# Patient Record
Sex: Female | Born: 1980 | Race: Black or African American | Hispanic: No | Marital: Single | Smoking: Current every day smoker
Health system: Southern US, Community
[De-identification: ages and names within clinical notes are randomized; demographics above are authoritative.]

## PROBLEM LIST (undated history)

## (undated) DIAGNOSIS — I10 Essential (primary) hypertension: Secondary | ICD-10-CM

## (undated) HISTORY — DX: Essential (primary) hypertension: I10

---
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
## 2002-07-01 ENCOUNTER — Other Ambulatory Visit: Admission: RE | Admit: 2002-07-01 | Discharge: 2002-07-01 | Payer: Self-pay | Admitting: Gynecology

## 2004-01-10 ENCOUNTER — Emergency Department (HOSPITAL_COMMUNITY): Admission: EM | Admit: 2004-01-10 | Discharge: 2004-01-10 | Payer: Self-pay | Admitting: Emergency Medicine

## 2004-11-21 ENCOUNTER — Emergency Department (HOSPITAL_COMMUNITY): Admission: EM | Admit: 2004-11-21 | Discharge: 2004-11-21 | Payer: Self-pay | Admitting: Emergency Medicine

## 2005-08-09 ENCOUNTER — Emergency Department (HOSPITAL_COMMUNITY): Admission: EM | Admit: 2005-08-09 | Discharge: 2005-08-09 | Payer: Self-pay | Admitting: Family Medicine

## 2005-12-14 ENCOUNTER — Emergency Department (HOSPITAL_COMMUNITY): Admission: AD | Admit: 2005-12-14 | Discharge: 2005-12-14 | Payer: Self-pay | Admitting: Family Medicine

## 2006-03-25 ENCOUNTER — Inpatient Hospital Stay (HOSPITAL_COMMUNITY): Admission: AD | Admit: 2006-03-25 | Discharge: 2006-03-26 | Payer: Self-pay | Admitting: Obstetrics & Gynecology

## 2006-04-06 ENCOUNTER — Inpatient Hospital Stay (HOSPITAL_COMMUNITY): Admission: AD | Admit: 2006-04-06 | Discharge: 2006-04-06 | Payer: Self-pay | Admitting: Obstetrics & Gynecology

## 2007-07-03 ENCOUNTER — Emergency Department (HOSPITAL_COMMUNITY): Admission: EM | Admit: 2007-07-03 | Discharge: 2007-07-03 | Payer: Self-pay | Admitting: Emergency Medicine

## 2007-07-03 ENCOUNTER — Emergency Department (HOSPITAL_COMMUNITY): Admission: EM | Admit: 2007-07-03 | Discharge: 2007-07-03 | Payer: Self-pay | Admitting: *Deleted

## 2008-03-16 ENCOUNTER — Emergency Department (HOSPITAL_COMMUNITY): Admission: EM | Admit: 2008-03-16 | Discharge: 2008-03-16 | Payer: Self-pay | Admitting: Emergency Medicine

## 2009-06-06 ENCOUNTER — Emergency Department (HOSPITAL_COMMUNITY): Admission: EM | Admit: 2009-06-06 | Discharge: 2009-06-06 | Payer: Self-pay | Admitting: Emergency Medicine

## 2009-08-30 ENCOUNTER — Emergency Department (HOSPITAL_COMMUNITY): Admission: EM | Admit: 2009-08-30 | Discharge: 2009-08-30 | Payer: Self-pay | Admitting: Family Medicine

## 2009-11-19 ENCOUNTER — Emergency Department (HOSPITAL_COMMUNITY): Admission: EM | Admit: 2009-11-19 | Discharge: 2009-11-19 | Payer: Self-pay | Admitting: Emergency Medicine

## 2010-12-08 LAB — POCT RAPID STREP A (OFFICE): Streptococcus, Group A Screen (Direct): POSITIVE — AB

## 2010-12-16 LAB — POCT URINALYSIS DIP (DEVICE)
Bilirubin Urine: NEGATIVE
Glucose, UA: NEGATIVE mg/dL
Hgb urine dipstick: NEGATIVE
Ketones, ur: NEGATIVE mg/dL
Nitrite: NEGATIVE
Protein, ur: NEGATIVE mg/dL
Specific Gravity, Urine: 1.015 (ref 1.005–1.030)
Urobilinogen, UA: 0.2 mg/dL (ref 0.0–1.0)
pH: 7.5 (ref 5.0–8.0)

## 2010-12-16 LAB — POCT I-STAT, CHEM 8
BUN: 9 mg/dL (ref 6–23)
Chloride: 103 mEq/L (ref 96–112)
Creatinine, Ser: 0.7 mg/dL (ref 0.4–1.2)
Glucose, Bld: 95 mg/dL (ref 70–99)
Potassium: 3.6 mEq/L (ref 3.5–5.1)
Sodium: 140 mEq/L (ref 135–145)

## 2010-12-16 LAB — POCT PREGNANCY, URINE: Preg Test, Ur: NEGATIVE

## 2010-12-16 LAB — GC/CHLAMYDIA PROBE AMP, GENITAL: GC Probe Amp, Genital: NEGATIVE

## 2010-12-16 LAB — CBC
HCT: 43.3 % (ref 36.0–46.0)
Hemoglobin: 14.6 g/dL (ref 12.0–15.0)
MCHC: 33.9 g/dL (ref 30.0–36.0)
MCV: 88.1 fL (ref 78.0–100.0)
RBC: 4.91 MIL/uL (ref 3.87–5.11)

## 2010-12-16 LAB — DIFFERENTIAL
Basophils Relative: 1 % (ref 0–1)
Eosinophils Absolute: 0.1 10*3/uL (ref 0.0–0.7)
Eosinophils Relative: 1 % (ref 0–5)
Lymphs Abs: 2.5 10*3/uL (ref 0.7–4.0)
Monocytes Absolute: 0.5 10*3/uL (ref 0.1–1.0)
Neutro Abs: 3.1 10*3/uL (ref 1.7–7.7)
Neutrophils Relative %: 51 % (ref 43–77)

## 2010-12-16 LAB — WET PREP, GENITAL: Trich, Wet Prep: NONE SEEN

## 2010-12-16 LAB — T4, FREE: Free T4: 1.06 ng/dL (ref 0.80–1.80)

## 2010-12-16 LAB — TSH: TSH: 0.963 u[IU]/mL (ref 0.350–4.500)

## 2010-12-20 LAB — DIFFERENTIAL
Basophils Absolute: 0.1 10*3/uL (ref 0.0–0.1)
Eosinophils Absolute: 0.2 10*3/uL (ref 0.0–0.7)
Lymphs Abs: 3.5 10*3/uL (ref 0.7–4.0)
Monocytes Absolute: 0.8 10*3/uL (ref 0.1–1.0)
Monocytes Relative: 11 % (ref 3–12)
Neutro Abs: 3.1 10*3/uL (ref 1.7–7.7)

## 2010-12-20 LAB — CBC
HCT: 37.7 % (ref 36.0–46.0)
Hemoglobin: 13.1 g/dL (ref 12.0–15.0)
MCHC: 34.8 g/dL (ref 30.0–36.0)
Platelets: 163 10*3/uL (ref 150–400)
RDW: 13.6 % (ref 11.5–15.5)

## 2010-12-20 LAB — BASIC METABOLIC PANEL
BUN: 9 mg/dL (ref 6–23)
CO2: 27 mEq/L (ref 19–32)
Calcium: 8.9 mg/dL (ref 8.4–10.5)
GFR calc non Af Amer: >60 mL/min (ref >60)
Glucose, Bld: 103 mg/dL — ABNORMAL HIGH (ref 70–99)
Potassium: 3.5 mEq/L (ref 3.5–5.1)
Sodium: 137 mEq/L (ref 135–145)

## 2010-12-20 LAB — URINALYSIS, ROUTINE W REFLEX MICROSCOPIC
Bilirubin Urine: NEGATIVE
Hgb urine dipstick: NEGATIVE
Specific Gravity, Urine: 1.015 (ref 1.005–1.030)
pH: 6 (ref 5.0–8.0)

## 2010-12-20 LAB — WET PREP, GENITAL: WBC, Wet Prep HPF POC: NONE SEEN

## 2010-12-20 LAB — URINE CULTURE: Colony Count: >=100000

## 2011-06-12 LAB — RAPID STREP SCREEN (MED CTR MEBANE ONLY): Streptococcus, Group A Screen (Direct): NEGATIVE

## 2011-06-25 LAB — WET PREP, GENITAL
WBC, Wet Prep HPF POC: NONE SEEN
Yeast Wet Prep HPF POC: NONE SEEN

## 2011-06-25 LAB — URINALYSIS, ROUTINE W REFLEX MICROSCOPIC
Glucose, UA: NEGATIVE
Ketones, ur: NEGATIVE
Nitrite: NEGATIVE
Protein, ur: NEGATIVE
Urobilinogen, UA: 0.2

## 2011-06-25 LAB — RPR: RPR Ser Ql: NONREACTIVE

## 2011-06-25 LAB — PREGNANCY, URINE: Preg Test, Ur: NEGATIVE

## 2011-07-27 ENCOUNTER — Emergency Department (INDEPENDENT_AMBULATORY_CARE_PROVIDER_SITE_OTHER)
Admission: EM | Admit: 2011-07-27 | Discharge: 2011-07-27 | Disposition: A | Discharge status: Home / Self Care | Payer: BC Managed Care – PPO | Source: Home / Self Care

## 2011-07-27 ENCOUNTER — Emergency Department (INDEPENDENT_AMBULATORY_CARE_PROVIDER_SITE_OTHER): Payer: BC Managed Care – PPO

## 2011-07-27 DIAGNOSIS — J209 Acute bronchitis, unspecified: Secondary | ICD-10-CM

## 2011-07-27 DIAGNOSIS — N390 Urinary tract infection, site not specified: Secondary | ICD-10-CM

## 2011-07-27 DIAGNOSIS — J208 Acute bronchitis due to other specified organisms: Secondary | ICD-10-CM

## 2011-07-27 LAB — POCT URINALYSIS DIP (DEVICE)
Bilirubin Urine: NEGATIVE
Glucose, UA: NEGATIVE mg/dL
Ketones, ur: NEGATIVE mg/dL
Nitrite: POSITIVE — AB
pH: 5 (ref 5.0–8.0)

## 2011-07-27 LAB — POCT PREGNANCY, URINE: Preg Test, Ur: NEGATIVE

## 2011-07-27 MED ORDER — HYDROCOD POLST-CHLORPHEN POLST 10-8 MG/5ML PO LQCR: 5.0000 mL | Freq: Two times a day (BID) | ORAL | Status: DC | PRN

## 2011-07-27 MED ORDER — IBUPROFEN 600 MG PO TABS: 600.0000 mg | ORAL_TABLET | Freq: Four times a day (QID) | ORAL | Status: AC | PRN

## 2011-07-27 MED ORDER — SULFAMETHOXAZOLE-TRIMETHOPRIM 800-160 MG PO TABS: 1.0000 | ORAL_TABLET | Freq: Two times a day (BID) | ORAL | Status: AC

## 2011-07-27 MED ORDER — ALBUTEROL SULFATE HFA 108 (90 BASE) MCG/ACT IN AERS: 1.0000 | INHALATION_SPRAY | Freq: Four times a day (QID) | RESPIRATORY_TRACT | Status: DC | PRN

## 2011-07-27 NOTE — ED Provider Notes (Signed)
History     CSN: [id number] Arrival date & time: 07/27/2011  4:36 PM   First MD Initiated Contact with Patient 07/27/11 1546      Chief Complaint  Patient presents with  . Nasal Congestion    Pt has pain and nasal stuffiness for one week with soreness in chest   HPI Comments: Pt with nasal congesiton, postnasal drip,throat irritation, chest tightness, wheezing worst at night. Reports SOB, nonproductive cough. Achy CP after coughing, better with lying on right side  Bodyaches started today. No fevers, N/V, diaphoresis, hemptoysis, post tussive emesis, abd pain, urinary c/o. Has not tried anything for this.   Patient is a 30 y.o. female presenting with URI. The history is provided by the patient. No language interpreter was used.  URI The primary symptoms include fatigue, sore throat, cough, wheezing and myalgias. Primary symptoms do not include fever, headaches, ear pain, swollen glands, abdominal pain, nausea, vomiting, arthralgias or rash. The current episode started more than 1 week ago. The problem has been gradually worsening.  The sore throat is not accompanied by trouble swallowing.  Symptoms associated with the illness include plugged ear sensation, sinus pressure, congestion and rhinorrhea. The illness is not associated with facial pain.    History reviewed. No pertinent past medical history.  History reviewed. No pertinent past surgical history.  History reviewed. No pertinent family history.  History  Substance Use Topics  . Smoking status: Current Everyday Smoker -- 0.5 packs/day    Types: Cigarettes  . Smokeless tobacco: Not on file  . Alcohol Use: Yes    OB History    Grav Para Term Preterm Abortions TAB SAB Ect Mult Living                  Review of Systems  Constitutional: Positive for fatigue. Negative for fever.  HENT: Positive for congestion, sore throat, rhinorrhea, postnasal drip and sinus pressure. Negative for ear pain, trouble swallowing, neck  stiffness and voice change.   Respiratory: Positive for cough and wheezing.   Gastrointestinal: Negative for nausea, vomiting and abdominal pain.  Genitourinary: Negative for dysuria, frequency, hematuria and flank pain.  Musculoskeletal: Positive for myalgias. Negative for arthralgias.  Skin: Negative for rash.  Neurological: Negative for headaches.    Allergies  Review of patient's allergies indicates no known allergies.  Home Medications   Current Outpatient Rx  Name Route Sig Dispense Refill  . ALBUTEROL SULFATE HFA 108 (90 BASE) MCG/ACT IN AERS Inhalation Inhale 1-2 puffs into the lungs every 6 (six) hours as needed for wheezing. 1 Inhaler 0  . HYDROCOD POLST-CHLORPHEN POLST 10-8 MG/5ML PO LQCR Oral Take 5 mLs by mouth every 12 (twelve) hours as needed. 115 mL 0  . IBUPROFEN 600 MG PO TABS Oral Take 1 tablet (600 mg total) by mouth every 6 (six) hours as needed for pain. 30 tablet 0  . SULFAMETHOXAZOLE-TRIMETHOPRIM 800-160 MG PO TABS Oral Take 1 tablet by mouth 2 (two) times daily. 6 tablet 0    BP 125/87  Pulse 71  Temp(Src) 98.7 F (37.1 C) (Oral)  Resp 16  SpO2 98%  LMP 07/07/2011  Physical Exam  Nursing note and vitals reviewed. Constitutional: She is oriented to person, place, and time. She appears well-developed and well-nourished. No distress.  HENT:  Head: Normocephalic and atraumatic.  Right Ear: Tympanic membrane, external ear and ear canal normal.  Left Ear: Tympanic membrane, external ear and ear canal normal.  Nose: Mucosal edema and rhinorrhea present. No epistaxis.  Mouth/Throat: Uvula is midline. Posterior oropharyngeal erythema present.  Eyes: Conjunctivae and EOM are normal. Pupils are equal, round, and reactive to light.  Neck: Normal range of motion. Neck supple.  Cardiovascular: Normal rate, regular rhythm and normal heart sounds.   Pulmonary/Chest: Effort normal and breath sounds normal. No respiratory distress. She has no wheezes. She has no  rales. She exhibits tenderness.       ? Crackles at bases  Abdominal: Soft. Bowel sounds are normal. She exhibits no distension. There is no tenderness. There is no rebound and no guarding.  Musculoskeletal: Normal range of motion. She exhibits no edema and no tenderness.  Lymphadenopathy:    She has no cervical adenopathy.  Neurological: She is alert and oriented to person, place, and time.  Skin: Skin is warm and dry. No rash noted.  Psychiatric: She has a normal mood and affect. Her behavior is normal. Judgment and thought content normal.    ED Course  Procedures (including critical care time)  Results for orders placed during the hospital encounter of 07/27/11  POCT URINALYSIS DIP (DEVICE)      Component Value Range   Glucose, UA NEGATIVE  NEGATIVE (mg/dL)   Bilirubin Urine NEGATIVE  NEGATIVE    Ketones, ur NEGATIVE  NEGATIVE (mg/dL)   Specific Gravity, Urine 1.020  1.005 - 1.030    Hgb urine dipstick NEGATIVE  NEGATIVE    pH 5.0  5.0 - 8.0    Protein, ur NEGATIVE  NEGATIVE (mg/dL)   Urobilinogen, UA 0.2  0.0 - 1.0 (mg/dL)   Nitrite POSITIVE (*) NEGATIVE    Leukocytes, UA TRACE (*) NEGATIVE   POCT PREGNANCY, URINE      Component Value Range   Preg Test, Ur NEGATIVE      DG CHEST 2 VIEW   Final Result:       cxr napd  MDM  Pt seen and examined. Pt declined pain medication at this time.    Danella Maiers [facility] 07/27/11 2147

## 2011-12-31 ENCOUNTER — Encounter (HOSPITAL_COMMUNITY): Payer: Self-pay | Admitting: *Deleted

## 2011-12-31 ENCOUNTER — Emergency Department (HOSPITAL_COMMUNITY)
Admission: EM | Admit: 2011-12-31 | Discharge: 2011-12-31 | Disposition: A | Discharge status: Home / Self Care | Payer: BC Managed Care – PPO | Attending: Emergency Medicine | Admitting: Emergency Medicine

## 2011-12-31 DIAGNOSIS — F17200 Nicotine dependence, unspecified, uncomplicated: Secondary | ICD-10-CM | POA: Insufficient documentation

## 2011-12-31 DIAGNOSIS — S058X9A Other injuries of unspecified eye and orbit, initial encounter: Secondary | ICD-10-CM | POA: Insufficient documentation

## 2011-12-31 DIAGNOSIS — Y9383 Activity, rough housing and horseplay: Secondary | ICD-10-CM | POA: Insufficient documentation

## 2011-12-31 DIAGNOSIS — IMO0002 Reserved for concepts with insufficient information to code with codable children: Secondary | ICD-10-CM | POA: Insufficient documentation

## 2011-12-31 DIAGNOSIS — S0501XA Injury of conjunctiva and corneal abrasion without foreign body, right eye, initial encounter: Secondary | ICD-10-CM

## 2011-12-31 MED ORDER — OXYCODONE-ACETAMINOPHEN 5-325 MG PO TABS: 1.0000 | ORAL_TABLET | ORAL | Status: AC | PRN

## 2011-12-31 MED ORDER — TETRACAINE HCL 0.5 % OP SOLN
OPHTHALMIC | Status: AC
  Administered 2011-12-31: 2 [drp]
  Filled 2011-12-31: qty 2

## 2011-12-31 MED ORDER — PROPARACAINE HCL 0.5 % OP SOLN: 1.0000 [drp] | Freq: Once | OPHTHALMIC | Status: DC

## 2011-12-31 MED ORDER — OFLOXACIN 0.3 % OP SOLN
2.0000 [drp] | Freq: Four times a day (QID) | OPHTHALMIC | Status: DC
  Administered 2011-12-31: 2 [drp] via OPHTHALMIC
  Filled 2011-12-31: qty 5

## 2011-12-31 NOTE — Discharge Instructions (Signed)
You have a corneal abrasion in your right eye. Apply drops, 2 drops 4 times a day into right eye. If no improvement tomorrow, call Dr. Burgess Estelle as referred for a close follow up within 1-2 days. Call him if symptoms are worsening.   Corneal Abrasion The cornea is the clear covering at the front and center of the eye. When looking at the colored portion (iris) of the eye, you are looking through that person's cornea.  This very thin tissue is made up of many layers. The surface layer is a single layer of cells called the corneal epithelium. This is one of the most sensitive tissues in the body. If a scratch or injury causes the corneal epithelium to come off, it is called a corneal abrasion. If the injury extends to the tissues below the epithelium, the condition is called a corneal ulcer.  CAUSES   Scratches.   Trauma.   Foreign body in the eye.   Some people have recurrences of abrasions in the area of the original injury even after they heal. This is called recurrent erosion syndrome. Recurrent erosion syndromes generally improve and go away with time.  SYMPTOMS   Eye pain.   Difficulty or inability to keep the injured eye open.   The eye becomes very sensitive to light.   Recurrent erosions tend to happen suddenly, first thing in the morning - usually upon awakening and opening the eyes.  DIAGNOSIS  Your eye professional can diagnose a corneal abrasion during an eye exam. Dye is usually placed in the eye using a drop or a small paper strip moistened by the patient's tears. When the eye is examined with a special light, the abrasion shows up clearly because of the dye. TREATMENT   Small abrasions may be treated with antibiotic drops or ointment alone.   Usually a pressure patch is specially applied. Pressure patches prevent the eye from blinking, allowing the corneal epithelium to heal. Because blinking is less, a pressure patch also reduces the amount of pain present in the eye during  healing. Most corneal abrasions heal within 2-3 days with no effect on vision. WARNING: Do not drive or operate machinery while your eye is patched. Your ability to judge distances is impaired.   If abrasion becomes infected and spreads to the deeper tissues of the cornea, a corneal ulcer can result. This is serious because it can cause corneal scarring. Corneal scars interfere with light passing through the cornea, and cause a loss of vision in the involved eye.   If your caregiver has given you a follow-up appointment, it is very important to keep that appointment. Not keeping the appointment could result in a severe eye infection or permanent loss of vision. If there is any problem keeping the appointment, you must call back to this facility for assistance.  SEEK MEDICAL CARE IF:   You have pain, light sensitivity and a scratchy feeling in one eye (or both).   Your pressure patch keeps loosening up and you can blink your eye under the patch after treatment.   Any kind of discharge develops from the involved eye after treatment or if the lids stick together in the morning.   You have the same symptoms in the morning as you did with the original abrasion days, weeks or months after the abrasion healed.  MAKE SURE YOU:   Understand these instructions.   Will watch your condition.   Will get help right away if you are not doing well  or get worse.  Document Released: 08/29/2000 Document Revised: 08/21/2011 Document Reviewed: 04/06/2008 [facility] Patient Information 2012 [place], [place].
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
## 2012-01-01 NOTE — ED Provider Notes (Signed)
Medical screening examination/treatment/procedure(s) were performed by non-physician practitioner and as supervising physician I was immediately available for consultation/collaboration.  Jeilani Grupe, MD 01/01/12 1750 

## 2013-11-21 ENCOUNTER — Other Ambulatory Visit: Payer: Self-pay | Admitting: Family Medicine

## 2013-11-21 DIAGNOSIS — R2231 Localized swelling, mass and lump, right upper limb: Secondary | ICD-10-CM

## 2013-11-30 ENCOUNTER — Ambulatory Visit
Admission: RE | Admit: 2013-11-30 | Discharge: 2013-11-30 | Disposition: A | Discharge status: Home / Self Care | Payer: BC Managed Care – PPO | Source: Ambulatory Visit | Attending: Family Medicine | Admitting: Family Medicine

## 2013-11-30 ENCOUNTER — Other Ambulatory Visit: Payer: Self-pay | Admitting: Family Medicine

## 2013-11-30 DIAGNOSIS — R2231 Localized swelling, mass and lump, right upper limb: Secondary | ICD-10-CM

## 2019-05-23 ENCOUNTER — Other Ambulatory Visit: Payer: Self-pay

## 2019-05-23 ENCOUNTER — Encounter (HOSPITAL_COMMUNITY): Payer: Self-pay | Admitting: Emergency Medicine

## 2019-05-23 ENCOUNTER — Ambulatory Visit (HOSPITAL_COMMUNITY)
Admission: EM | Admit: 2019-05-23 | Discharge: 2019-05-23 | Disposition: A | Discharge status: Home / Self Care | Payer: Self-pay

## 2019-05-23 ENCOUNTER — Emergency Department (HOSPITAL_COMMUNITY)
Admission: EM | Admit: 2019-05-23 | Discharge: 2019-05-23 | Disposition: A | Discharge status: Home / Self Care | Payer: 59 | Attending: Emergency Medicine | Admitting: Emergency Medicine

## 2019-05-23 ENCOUNTER — Emergency Department (HOSPITAL_COMMUNITY): Payer: 59

## 2019-05-23 ENCOUNTER — Encounter (HOSPITAL_COMMUNITY): Payer: Self-pay

## 2019-05-23 DIAGNOSIS — Y92410 Unspecified street and highway as the place of occurrence of the external cause: Secondary | ICD-10-CM | POA: Diagnosis not present

## 2019-05-23 DIAGNOSIS — Y999 Unspecified external cause status: Secondary | ICD-10-CM | POA: Diagnosis not present

## 2019-05-23 DIAGNOSIS — Y9389 Activity, other specified: Secondary | ICD-10-CM | POA: Diagnosis not present

## 2019-05-23 DIAGNOSIS — S1081XA Abrasion of other specified part of neck, initial encounter: Secondary | ICD-10-CM | POA: Diagnosis not present

## 2019-05-23 DIAGNOSIS — R030 Elevated blood-pressure reading, without diagnosis of hypertension: Secondary | ICD-10-CM | POA: Insufficient documentation

## 2019-05-23 DIAGNOSIS — M542 Cervicalgia: Secondary | ICD-10-CM

## 2019-05-23 DIAGNOSIS — M25552 Pain in left hip: Secondary | ICD-10-CM | POA: Diagnosis not present

## 2019-05-23 DIAGNOSIS — S199XXA Unspecified injury of neck, initial encounter: Secondary | ICD-10-CM | POA: Diagnosis present

## 2019-05-23 DIAGNOSIS — M25562 Pain in left knee: Secondary | ICD-10-CM | POA: Insufficient documentation

## 2019-05-23 DIAGNOSIS — Z79899 Other long term (current) drug therapy: Secondary | ICD-10-CM | POA: Diagnosis not present

## 2019-05-23 DIAGNOSIS — M549 Dorsalgia, unspecified: Secondary | ICD-10-CM | POA: Insufficient documentation

## 2019-05-23 DIAGNOSIS — F17210 Nicotine dependence, cigarettes, uncomplicated: Secondary | ICD-10-CM | POA: Insufficient documentation

## 2019-05-23 MED ORDER — OXYCODONE-ACETAMINOPHEN 5-325 MG PO TABS
1.0000 | ORAL_TABLET | Freq: Once | ORAL | Status: AC
  Administered 2019-05-23: 1 via ORAL
  Filled 2019-05-23: qty 1

## 2019-05-23 MED ORDER — CYCLOBENZAPRINE HCL 5 MG PO TABS: 5.0000 mg | ORAL_TABLET | Freq: Two times a day (BID) | ORAL | 0 refills | Status: DC | PRN
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
## 2019-05-23 NOTE — ED Notes (Signed)
Pt's bp 174/117. Pt is asymptomatic. PA notified and is educating pt.
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
## 2019-05-23 NOTE — ED Notes (Signed)
Patient transported to CT 
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
## 2019-05-23 NOTE — Discharge Instructions (Signed)
Please read attached information. If you experience any new or worsening signs or symptoms please return to the emergency room for evaluation. Please follow-up with your primary care provider or specialist as discussed. Please use medication prescribed only as directed and discontinue taking if you have any concerning signs or symptoms.   °
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
## 2019-05-29 ENCOUNTER — Other Ambulatory Visit: Payer: Self-pay

## 2019-05-29 ENCOUNTER — Encounter (HOSPITAL_COMMUNITY): Payer: Self-pay

## 2019-05-29 ENCOUNTER — Ambulatory Visit (HOSPITAL_COMMUNITY)
Admission: EM | Admit: 2019-05-29 | Discharge: 2019-05-29 | Disposition: A | Discharge status: Home / Self Care | Payer: Self-pay | Attending: Family Medicine | Admitting: Family Medicine

## 2019-05-29 DIAGNOSIS — M7918 Myalgia, other site: Secondary | ICD-10-CM

## 2019-05-29 DIAGNOSIS — R519 Headache, unspecified: Secondary | ICD-10-CM

## 2019-05-29 MED ORDER — TIZANIDINE HCL 4 MG PO TABS: 4.0000 mg | ORAL_TABLET | Freq: Four times a day (QID) | ORAL | 0 refills | Status: DC | PRN

## 2019-05-29 MED ORDER — IBUPROFEN 800 MG PO TABS: 800.0000 mg | ORAL_TABLET | Freq: Three times a day (TID) | ORAL | 0 refills | Status: DC

## 2019-05-29 NOTE — Discharge Instructions (Signed)
Activity as tolerated.  Avoid overdoing it Ice or heat to painful areas Take ibuprofen 3 times a day with food Take tizanidine as needed as a muscle relaxer.  This is useful at bedtime Follow-up with primary care.
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
## 2019-06-08 ENCOUNTER — Ambulatory Visit: Payer: 59 | Admitting: Internal Medicine

## 2019-06-08 ENCOUNTER — Encounter: Payer: Self-pay | Admitting: Internal Medicine

## 2019-06-08 ENCOUNTER — Other Ambulatory Visit: Payer: Self-pay

## 2019-06-08 VITALS — BP 156/103 | HR 88 | Temp 98.6°F | Ht 69.0 in | Wt 177.3 lb

## 2019-06-08 DIAGNOSIS — Z720 Tobacco use: Secondary | ICD-10-CM

## 2019-06-08 DIAGNOSIS — F17210 Nicotine dependence, cigarettes, uncomplicated: Secondary | ICD-10-CM

## 2019-06-08 DIAGNOSIS — R030 Elevated blood-pressure reading, without diagnosis of hypertension: Secondary | ICD-10-CM

## 2019-06-08 DIAGNOSIS — M545 Low back pain, unspecified: Secondary | ICD-10-CM | POA: Insufficient documentation

## 2019-06-08 DIAGNOSIS — M62830 Muscle spasm of back: Secondary | ICD-10-CM | POA: Diagnosis not present

## 2019-06-08 DIAGNOSIS — M542 Cervicalgia: Secondary | ICD-10-CM

## 2019-06-08 DIAGNOSIS — G8911 Acute pain due to trauma: Secondary | ICD-10-CM

## 2019-06-08 MED ORDER — IBUPROFEN 800 MG PO TABS: 800.0000 mg | ORAL_TABLET | Freq: Three times a day (TID) | ORAL | 0 refills | Status: AC

## 2019-06-08 MED ORDER — NICOTINE POLACRILEX 4 MG MT GUM: 4.0000 mg | CHEWING_GUM | OROMUCOSAL | 0 refills | Status: DC | PRN

## 2019-06-08 MED ORDER — NICOTINE 7 MG/24HR TD PT24: 7.0000 mg | MEDICATED_PATCH | Freq: Every day | TRANSDERMAL | 0 refills | Status: DC

## 2019-06-08 NOTE — Patient Instructions (Addendum)
Ms. Berber,  It was nice meeting you today. To help with your back spasms, please take the 800mg  ibuprofen 3 times a day with meals and your muscle relaxant at night. Continue to stretch and move in order to loosen the muscles and help with the pain. You can use ice or a heating pad at home as well.   I have sent nicotine gum and patches into your pharmacy to help you quit smoking. This is a great goal that will help your health for the rest of your life.  Please follow-up with our clinic in the coming months for routine health care.

## 2019-06-08 NOTE — Progress Notes (Signed)
   CC: muscle spasms of back  HPI:  Angel Chavez is a 38 y.o. F with no significant PMH who presents to clinic today for muscle spasms. Please see problem-based charting for additional information.  No past medical history on file.   Social History: Current smoker, 1/2ppd for 10 years. Drinks EtOH socially, 1-2 times per month. Endorses marijuana use, denies any other substance use. Lives alone at home. Works as a Publishing copy.  Family History: Mom - prediabetic, hyperthyroidism, HTN Dad - diabetes and HTN Grandparents - cancer (uncertain what types)  Review of Systems:   Review of Systems  Constitutional: Negative for chills and fever.  HENT: Negative for congestion, sinus pain and sore throat.   Eyes: Negative for blurred vision.  Respiratory: Negative for shortness of breath.   Cardiovascular: Negative for chest pain.  Gastrointestinal: Negative for abdominal pain, nausea and vomiting.  Genitourinary: Negative for dysuria and frequency.  Musculoskeletal: Positive for back pain and neck pain. Negative for joint pain.  Skin: Negative for rash.  Neurological: Negative for dizziness, weakness and headaches.   Physical Exam:  Vitals:   06/08/19 0906  BP: (!) 156/103  Pulse: 88  Temp: 98.6 F (37 C)  TempSrc: Oral  SpO2: 100%  Weight: 177 lb 4.8 oz (80.4 kg)  Height: 5\' 9"  (1.753 m)   Physical Exam Vitals signs and nursing note reviewed.  Constitutional:      General: She is not in acute distress.    Appearance: Normal appearance.  Neck:     Musculoskeletal: Normal range of motion. No muscular tenderness.  Cardiovascular:     Rate and Rhythm: Normal rate and regular rhythm.     Heart sounds: Normal heart sounds.  Pulmonary:     Effort: Pulmonary effort is normal.     Breath sounds: Normal breath sounds.  Abdominal:     General: Abdomen is flat. Bowel sounds are normal.  Musculoskeletal:     Comments: Full ROM, though slow. Point tenderness at  paraspinal muscles of low back.  Skin:    General: Skin is warm and dry.  Neurological:     Mental Status: She is alert.    Assessment & Plan:   See Encounters Tab for problem based charting.  Patient seen with Dr. Lynnae January

## 2019-06-10 NOTE — Assessment & Plan Note (Signed)
Pt currently smokes 1/2 pack per day. She has been smoking for 10 years, and is now interested in quitting. States that her father was able to quit using the patch.  Plan - prescription sent in for nicotine patch and gum - referral to behavior health for potential CBT to aid in cessation
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
## 2019-06-11 ENCOUNTER — Telehealth: Payer: 59 | Admitting: Nurse Practitioner

## 2019-06-11 DIAGNOSIS — M5442 Lumbago with sciatica, left side: Secondary | ICD-10-CM

## 2019-06-11 MED ORDER — CYCLOBENZAPRINE HCL 10 MG PO TABS: 10.0000 mg | ORAL_TABLET | Freq: Three times a day (TID) | ORAL | 1 refills | Status: DC | PRN

## 2019-06-11 MED ORDER — NAPROXEN 500 MG PO TABS: 500.0000 mg | ORAL_TABLET | Freq: Two times a day (BID) | ORAL | 1 refills | Status: DC

## 2019-06-11 NOTE — Progress Notes (Signed)

## 2019-06-13 ENCOUNTER — Encounter: Payer: Self-pay | Admitting: Internal Medicine

## 2019-06-13 ENCOUNTER — Ambulatory Visit: Payer: 59 | Admitting: Internal Medicine

## 2019-06-13 ENCOUNTER — Other Ambulatory Visit: Payer: Self-pay

## 2019-06-13 VITALS — BP 151/90 | HR 68 | Temp 98.4°F | Ht 69.0 in | Wt 176.5 lb

## 2019-06-13 DIAGNOSIS — G8911 Acute pain due to trauma: Secondary | ICD-10-CM | POA: Diagnosis not present

## 2019-06-13 DIAGNOSIS — M545 Low back pain: Secondary | ICD-10-CM

## 2019-06-13 DIAGNOSIS — Z791 Long term (current) use of non-steroidal anti-inflammatories (NSAID): Secondary | ICD-10-CM | POA: Diagnosis not present

## 2019-06-13 DIAGNOSIS — M542 Cervicalgia: Secondary | ICD-10-CM | POA: Diagnosis not present

## 2019-06-13 DIAGNOSIS — Z79899 Other long term (current) drug therapy: Secondary | ICD-10-CM

## 2019-06-13 DIAGNOSIS — M62830 Muscle spasm of back: Secondary | ICD-10-CM

## 2019-06-13 NOTE — Patient Instructions (Addendum)
Angel Chavez,  I am sorry to hear your pain is not improving. Back pain can take time to heal, Continue taking an anti-inflammatory medication (naproxen) and your muscle relaxant at bed time. I have also put in a referral to physical therapy. Attached below are stretches you can do at home, as it is beneficial to stay active to help in healing. Please call a doctor or go to the emergency department if you begin experiencing fevers/chills, weakness or numbness in your legs, or you develop an inability to control you bowel or bladder.    Acute Back Pain, Adult Acute back pain is sudden and usually short-lived. It is often caused by an injury to the muscles and tissues in the back. The injury may result from:  A muscle or ligament getting overstretched or torn (strained). Ligaments are tissues that connect bones to each other. Lifting something improperly can cause a back strain.  Wear and tear (degeneration) of the spinal disks. Spinal disks are circular tissue that provides cushioning between the bones of the spine (vertebrae).  Twisting motions, such as while playing sports or doing yard work.  A hit to the back.  Arthritis. You may have a physical exam, lab tests, and imaging tests to find the cause of your pain. Acute back pain usually goes away with rest and home care. Follow these instructions at home: Managing pain, stiffness, and swelling  Take over-the-counter and prescription medicines only as told by your health care provider.  Your health care provider may recommend applying ice during the first 24-48 hours after your pain starts. To do this: ? Put ice in a plastic bag. ? Place a towel between your skin and the bag. ? Leave the ice on for 20 minutes, 2-3 times a day.  If directed, apply heat to the affected area as often as told by your health care provider. Use the heat source that your health care provider recommends, such as a moist heat pack or a heating pad. ? Place a towel  between your skin and the heat source. ? Leave the heat on for 20-30 minutes. ? Remove the heat if your skin turns bright red. This is especially important if you are unable to feel pain, heat, or cold. You have a greater risk of getting burned. Activity   Do not stay in bed. Staying in bed for more than 1-2 days can delay your recovery.  Sit up and stand up straight. Avoid leaning forward when you sit, or hunching over when you stand. ? If you work at a desk, sit close to it so you do not need to lean over. Keep your chin tucked in. Keep your neck drawn back, and keep your elbows bent at a right angle. Your arms should look like the letter "L." ? Sit high and close to the steering wheel when you drive. Add lower back (lumbar) support to your car seat, if needed.  Take short walks on even surfaces as soon as you are able. Try to increase the length of time you walk each day.  Do not sit, drive, or stand in one place for more than 30 minutes at a time. Sitting or standing for long periods of time can put stress on your back.  Do not drive or use heavy machinery while taking prescription pain medicine.  Use proper lifting techniques. When you bend and lift, use positions that put less stress on your back: ? Lyman your knees. ? Keep the load close to  your body. ? Avoid twisting.  Exercise regularly as told by your health care provider. Exercising helps your back heal faster and helps prevent back injuries by keeping muscles strong and flexible.  Work with a physical therapist to make a safe exercise program, as recommended by your health care provider. Do any exercises as told by your physical therapist. Lifestyle  Maintain a healthy weight. Extra weight puts stress on your back and makes it difficult to have good posture.  Avoid activities or situations that make you feel anxious or stressed. Stress and anxiety increase muscle tension and can make back pain worse. Learn ways to manage  anxiety and stress, such as through exercise. General instructions  Sleep on a firm mattress in a comfortable position. Try lying on your side with your knees slightly bent. If you lie on your back, put a pillow under your knees.  Follow your treatment plan as told by your health care provider. This may include: ? Cognitive or behavioral therapy. ? Acupuncture or massage therapy. ? Meditation or yoga. Contact a health care provider if:  You have pain that is not relieved with rest or medicine.  You have increasing pain going down into your legs or buttocks.  Your pain does not improve after 2 weeks.  You have pain at night.  You lose weight without trying.  You have a fever or chills. Get help right away if:  You develop new bowel or bladder control problems.  You have unusual weakness or numbness in your arms or legs.  You develop nausea or vomiting.  You develop abdominal pain.  You feel faint. Summary  Acute back pain is sudden and usually short-lived.  Use proper lifting techniques. When you bend and lift, use positions that put less stress on your back.  Take over-the-counter and prescription medicines and apply heat or ice as directed by your health care provider. This information is not intended to replace advice given to you by your health care provider. Make sure you discuss any questions you have with your health care provider. Document Released: 09/01/2005 Document Revised: 12/21/2018 Document Reviewed: 04/15/2017 Elsevier Patient Education  2020 [employer]     Back Exercises The following exercises strengthen the muscles that help to support the trunk and back. They also help to keep the lower back flexible. Doing these exercises can help to prevent back pain or lessen existing pain.  If you have back pain or discomfort, try doing these exercises 2-3 times each day or as told by your health care provider.  As your pain improves, do them once each  day, but increase the number of times that you repeat the steps for each exercise (do more repetitions).  To prevent the recurrence of back pain, continue to do these exercises once each day or as told by your health care provider. Do exercises exactly as told by your health care provider and adjust them as directed. It is normal to feel mild stretching, pulling, tightness, or discomfort as you do these exercises, but you should stop right away if you feel sudden pain or your pain gets worse. Exercises Single knee to chest Repeat these steps 3-5 times for each leg: 1. Lie on your back on a firm bed or the floor with your legs extended. 2. Bring one knee to your chest. Your other leg should stay extended and in contact with the floor. 3. Hold your knee in place by grabbing your knee or thigh with both hands and  hold. 4. Pull on your knee until you feel a gentle stretch in your lower back or buttocks. 5. Hold the stretch for 10-30 seconds. 6. Slowly release and straighten your leg. Pelvic tilt Repeat these steps 5-10 times: 1. Lie on your back on a firm bed or the floor with your legs extended. 2. Bend your knees so they are pointing toward the ceiling and your feet are flat on the floor. 3. Tighten your lower abdominal muscles to press your lower back against the floor. This motion will tilt your pelvis so your tailbone points up toward the ceiling instead of pointing to your feet or the floor. 4. With gentle tension and even breathing, hold this position for 5-10 seconds. Cat-cow Repeat these steps until your lower back becomes more flexible: 1. Get into a hands-and-knees position on a firm surface. Keep your hands under your shoulders, and keep your knees under your hips. You may place padding under your knees for comfort. 2. Let your head hang down toward your chest. Contract your abdominal muscles and point your tailbone toward the floor so your lower back becomes rounded like the back of a  cat. 3. Hold this position for 5 seconds. 4. Slowly lift your head, let your abdominal muscles relax and point your tailbone up toward the ceiling so your back forms a sagging arch like the back of a cow. 5. Hold this position for 5 seconds.  Press-ups Repeat these steps 5-10 times: 1. Lie on your abdomen (face-down) on the floor. 2. Place your palms near your head, about shoulder-width apart. 3. Keeping your back as relaxed as possible and keeping your hips on the floor, slowly straighten your arms to raise the top half of your body and lift your shoulders. Do not use your back muscles to raise your upper torso. You may adjust the placement of your hands to make yourself more comfortable. 4. Hold this position for 5 seconds while you keep your back relaxed. 5. Slowly return to lying flat on the floor.  Bridges Repeat these steps 10 times: 1. Lie on your back on a firm surface. 2. Bend your knees so they are pointing toward the ceiling and your feet are flat on the floor. Your arms should be flat at your sides, next to your body. 3. Tighten your buttocks muscles and lift your buttocks off the floor until your waist is at almost the same height as your knees. You should feel the muscles working in your buttocks and the back of your thighs. If you do not feel these muscles, slide your feet 1-2 inches farther away from your buttocks. 4. Hold this position for 3-5 seconds. 5. Slowly lower your hips to the starting position, and allow your buttocks muscles to relax completely. If this exercise is too easy, try doing it with your arms crossed over your chest. Abdominal crunches Repeat these steps 5-10 times: 1. Lie on your back on a firm bed or the floor with your legs extended. 2. Bend your knees so they are pointing toward the ceiling and your feet are flat on the floor. 3. Cross your arms over your chest. 4. Tip your chin slightly toward your chest without bending your neck. 5. Tighten your  abdominal muscles and slowly raise your trunk (torso) high enough to lift your shoulder blades a tiny bit off the floor. Avoid raising your torso higher than that because it can put too much stress on your low back and does not help to  strengthen your abdominal muscles. 6. Slowly return to your starting position. Back lifts Repeat these steps 5-10 times: 1. Lie on your abdomen (face-down) with your arms at your sides, and rest your forehead on the floor. 2. Tighten the muscles in your legs and your buttocks. 3. Slowly lift your chest off the floor while you keep your hips pressed to the floor. Keep the back of your head in line with the curve in your back. Your eyes should be looking at the floor. 4. Hold this position for 3-5 seconds. 5. Slowly return to your starting position. Contact a health care provider if:  Your back pain or discomfort gets much worse when you do an exercise.  Your worsening back pain or discomfort does not lessen within 2 hours after you exercise. If you have any of these problems, stop doing these exercises right away. Do not do them again unless your health care provider says that you can. Get help right away if:  You develop sudden, severe back pain. If this happens, stop doing the exercises right away. Do not do them again unless your health care provider says that you can. This information is not intended to replace advice given to you by your health care provider. Make sure you discuss any questions you have with your health care provider. Document Released: 10/09/2004 Document Revised: 01/06/2019 Document Reviewed: 06/03/2018 Elsevier Patient Education  2020 [employer].
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
## 2019-06-13 NOTE — Progress Notes (Signed)
[facility] Clinic Attending  I saw and evaluated the patient.  I personally confirmed the key portions of the history and exam documented by Dr. Jones and I reviewed pertinent patient test results.  The assessment, diagnosis, and plan were formulated together and I agree with the documentation in the resident's note.     
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
## 2019-06-14 ENCOUNTER — Encounter: Payer: Self-pay | Admitting: Licensed Clinical Social Worker

## 2019-06-14 ENCOUNTER — Telehealth: Payer: Self-pay | Admitting: Licensed Clinical Social Worker

## 2019-06-14 NOTE — Telephone Encounter (Signed)
Patient was called, but her phone call would not go through. Patient was contacted to discuss referral from her doctor. A letter will be mailed to attempt to establish care.

## 2019-06-16 ENCOUNTER — Telehealth: Payer: Self-pay | Admitting: Licensed Clinical Social Worker

## 2019-06-16 NOTE — Telephone Encounter (Signed)
Patient contacted (2nd attempt) to discuss referral. The call would not go through. Letter was mailed on 9/29.

## 2019-06-20 ENCOUNTER — Encounter: Payer: Self-pay | Admitting: *Deleted

## 2019-07-16 ENCOUNTER — Encounter: Payer: Self-pay | Admitting: Internal Medicine

## 2019-07-16 ENCOUNTER — Other Ambulatory Visit: Payer: Self-pay | Admitting: Internal Medicine

## 2019-07-18 MED ORDER — NICOTINE 7 MG/24HR TD PT24: 7.0000 mg | MEDICATED_PATCH | Freq: Every day | TRANSDERMAL | 0 refills | Status: DC

## 2019-07-18 NOTE — Addendum Note (Signed)
Addended by: Hulan Fray on: 07/18/2019 06:39 PM   Modules accepted: Orders

## 2019-07-20 NOTE — Addendum Note (Signed)
Addended by: Hulan Fray on: 07/20/2019 03:43 PM   Modules accepted: Orders

## 2019-08-05 ENCOUNTER — Other Ambulatory Visit: Payer: Self-pay | Admitting: Nurse Practitioner

## 2019-08-10 ENCOUNTER — Other Ambulatory Visit: Payer: Self-pay | Admitting: Nurse Practitioner

## 2020-01-04 ENCOUNTER — Encounter: Payer: Self-pay | Admitting: *Deleted

## 2020-06-14 ENCOUNTER — Ambulatory Visit: Payer: Self-pay | Admitting: *Deleted

## 2020-06-14 NOTE — Telephone Encounter (Signed)
   Reason for Disposition . Cough present > 3 weeks  Answer Assessment - Initial Assessment Questions 1. COVID-19 DIAGNOSIS: "Who made your Coronavirus (COVID-19) diagnosis?" "Was it confirmed by a positive lab test?" If not diagnosed by a HCP, ask "Are there lots of cases (community spread) where you live?" (See public health department website, if unsure)     Clinic 2. COVID-19 EXPOSURE: "Was there any known exposure to COVID before the symptoms began?" CDC Definition of close contact: within 6 feet (2 meters) for a total of 15 minutes or more over a 24-hour period.      yes 3. ONSET: "When did the COVID-19 symptoms start?"     9/18 4. WORST SYMPTOM: "What is your worst symptom?" (e.g., cough, fever, shortness of breath, muscle aches)     No taste or smell,darrhea, already vaccinated. 5. COUGH: "Do you have a cough?" If Yes, ask: "How bad is the cough?"       Not anymore 6. FEVER: "Do you have a fever?" If Yes, ask: "What is your temperature, how was it measured, and when did it start?"     No fever 7. RESPIRATORY STATUS: "Describe your breathing?" (e.g., shortness of breath, wheezing, unable to speak)      no 8. BETTER-SAME-WORSE: "Are you getting better, staying the same or getting worse compared to yesterday?"  If getting worse, ask, "In what way?"     Better but still having chest pain 9. HIGH RISK DISEASE: "Do you have any chronic medical problems?" (e.g., asthma, heart or lung disease, weak immune system, obesity, etc.)     no 10. PREGNANCY: "Is there any chance you are pregnant?" "When was your last menstrual period?"       no 11. OTHER SYMPTOMS: "Do you have any other symptoms?"  (e.g., chills, fatigue, headache, loss of smell or taste, muscle pain, sore throat; new loss of smell or taste especially support the diagnosis of COVID-19)       no  Protocols used: CORONAVIRUS (COVID-19) DIAGNOSED OR SUSPECTED-A-AH Chest pain comes and goes and feels tight and heavy. Has it daily for  three days now. Lasts a few minutes. Just started worrying if she she be seen since she was diagnosed at a clinic and not a medical office. I reminded her who her PCP is and gave his number, I also explained that this is advice, if she feels she needs to be seen tonight the ED is the choice and she can certainly go if she feels she needs to. I also instructed her on MyChart tele visits with MDs.

## 2021-03-19 ENCOUNTER — Encounter: Payer: Self-pay | Admitting: *Deleted

## 2021-08-30 ENCOUNTER — Other Ambulatory Visit: Payer: Self-pay

## 2021-08-30 ENCOUNTER — Encounter (HOSPITAL_COMMUNITY): Payer: Self-pay | Admitting: Emergency Medicine

## 2021-08-30 ENCOUNTER — Emergency Department (HOSPITAL_COMMUNITY)
Admission: EM | Admit: 2021-08-30 | Discharge: 2021-08-30 | Disposition: A | Discharge status: Home / Self Care | Payer: 59 | Attending: Emergency Medicine | Admitting: Emergency Medicine

## 2021-08-30 DIAGNOSIS — M542 Cervicalgia: Secondary | ICD-10-CM | POA: Diagnosis not present

## 2021-08-30 DIAGNOSIS — R519 Headache, unspecified: Secondary | ICD-10-CM | POA: Diagnosis not present

## 2021-08-30 DIAGNOSIS — M545 Low back pain, unspecified: Secondary | ICD-10-CM | POA: Diagnosis not present

## 2021-08-30 DIAGNOSIS — F17210 Nicotine dependence, cigarettes, uncomplicated: Secondary | ICD-10-CM | POA: Diagnosis not present

## 2021-08-30 DIAGNOSIS — Y92410 Unspecified street and highway as the place of occurrence of the external cause: Secondary | ICD-10-CM | POA: Diagnosis not present

## 2021-08-30 DIAGNOSIS — M25561 Pain in right knee: Secondary | ICD-10-CM | POA: Diagnosis not present

## 2021-08-30 MED ORDER — CYCLOBENZAPRINE HCL 10 MG PO TABS: 5.0000 mg | ORAL_TABLET | Freq: Two times a day (BID) | ORAL | 0 refills | Status: DC | PRN

## 2021-08-30 MED ORDER — CELECOXIB 200 MG PO CAPS: 200.0000 mg | ORAL_CAPSULE | Freq: Two times a day (BID) | ORAL | 0 refills | Status: DC
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
## 2021-08-30 NOTE — Discharge Instructions (Signed)

## 2021-09-04 ENCOUNTER — Ambulatory Visit: Payer: Self-pay | Admitting: Nurse Practitioner

## 2021-09-13 ENCOUNTER — Ambulatory Visit: Payer: Self-pay | Admitting: Family

## 2021-09-15 DIAGNOSIS — A599 Trichomoniasis, unspecified: Secondary | ICD-10-CM

## 2021-09-15 HISTORY — DX: Trichomoniasis, unspecified: A59.9

## 2021-09-27 ENCOUNTER — Encounter: Payer: Self-pay | Admitting: Family

## 2021-09-27 ENCOUNTER — Ambulatory Visit (INDEPENDENT_AMBULATORY_CARE_PROVIDER_SITE_OTHER): Payer: 59 | Admitting: Family

## 2021-09-27 ENCOUNTER — Other Ambulatory Visit: Payer: Self-pay

## 2021-09-27 VITALS — BP 142/100 | HR 92 | Temp 97.3°F | Resp 16 | Ht 69.0 in | Wt 166.2 lb

## 2021-09-27 DIAGNOSIS — F411 Generalized anxiety disorder: Secondary | ICD-10-CM | POA: Diagnosis not present

## 2021-09-27 DIAGNOSIS — Z7689 Persons encountering health services in other specified circumstances: Secondary | ICD-10-CM

## 2021-09-27 DIAGNOSIS — I10 Essential (primary) hypertension: Secondary | ICD-10-CM

## 2021-09-27 DIAGNOSIS — G44319 Acute post-traumatic headache, not intractable: Secondary | ICD-10-CM

## 2021-09-27 MED ORDER — LOSARTAN POTASSIUM 25 MG PO TABS: 25.0000 mg | ORAL_TABLET | Freq: Every day | ORAL | 0 refills | Status: DC

## 2021-09-27 MED ORDER — SERTRALINE HCL 25 MG PO TABS: 25.0000 mg | ORAL_TABLET | Freq: Every day | ORAL | 3 refills | Status: DC
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
## 2021-09-27 NOTE — Patient Instructions (Signed)
-  check Blood pressure at home and record on log provided and notify provider if B/p > 140/90   - Please call and schedule appointment with a counselor Psychiatry service   Sertraline Tablets What is this medication? SERTRALINE (SER tra leen) treats depression, anxiety, obsessive-compulsive disorder (OCD), post-traumatic stress disorder (PTSD), and premenstrual dysphoric disorder (PMDD). It increases the amount of serotonin in the brain, a hormone that helps regulate mood. It belongs to a group of medications called SSRIs. This medicine may be used for other purposes; ask your health care provider or pharmacist if you have questions. COMMON BRAND NAME(S): Zoloft What should I tell my care team before I take this medication? They need to know if you have any of these conditions: Bleeding disorders Bipolar disorder or a family history of bipolar disorder Frequently drink alcohol Glaucoma Heart disease High blood pressure History of irregular heartbeat History of low levels of calcium, magnesium, or potassium in the blood Liver disease Receiving electroconvulsive therapy Seizures Suicidal thoughts, plans, or attempt; a previous suicide attempt by you or a family member Take medications that prevent or treat blood clots Thyroid disease An unusual or allergic reaction to sertraline, other medications, foods, dyes, or preservatives Pregnant or trying to get pregnant Breast-feeding How should I use this medication? Take this medication by mouth with a glass of water. Follow the directions on the prescription label. You can take it with or without food. Take your medication at regular intervals. Do not take your medication more often than directed. Do not stop taking this medication suddenly except upon the advice of your care team. Stopping this medication too quickly may cause serious side effects or your condition may worsen. A special MedGuide will be given to you by the pharmacist with  each prescription and refill. Be sure to read this information carefully each time. Talk to your care team about the use of this medication in children. While this medication may be prescribed for children as young as 7 years for selected conditions, precautions do apply. Overdosage: If you think you have taken too much of this medicine contact a poison control center or emergency room at once. NOTE: This medicine is only for you. Do not share this medicine with others. What if I miss a dose? If you miss a dose, take it as soon as you can. If it is almost time for your next dose, take only that dose. Do not take double or extra doses. What may interact with this medication? Do not take this medication with any of the following: Cisapride Dronedarone Linezolid MAOIs like Carbex, Eldepryl, Marplan, Nardil, and Parnate Methylene blue (injected into a vein) Pimozide Thioridazine This medication may also interact with the following: Alcohol Amphetamines Aspirin and aspirin-like medications Certain medications for depression, anxiety, or other mental health conditions Certain medications for fungal infections like ketoconazole, fluconazole, posaconazole, and itraconazole Certain medications for irregular heart beat like flecainide, quinidine, propafenone Certain medications for migraine headaches like almotriptan, eletriptan, frovatriptan, naratriptan, rizatriptan, sumatriptan, zolmitriptan Certain medications for sleep Certain medications for seizures like carbamazepine, valproic acid, phenytoin Certain medications that treat or prevent blood clots like warfarin, enoxaparin, dalteparin Cimetidine Digoxin Diuretics Fentanyl Isoniazid Lithium NSAIDs, medications for pain and inflammation, like ibuprofen or naproxen Other medications that prolong the QT interval (cause an abnormal heart rhythm) like dofetilide Rasagiline Safinamide Supplements like St. John's wort, kava kava,  valerian Tolbutamide Tramadol Tryptophan This list may not describe all possible interactions. Give your health care provider a  list of all the medicines, herbs, non-prescription drugs, or dietary supplements you use. Also tell them if you smoke, drink alcohol, or use illegal drugs. Some items may interact with your medicine. What should I watch for while using this medication? Tell your care team if your symptoms do not get better or if they get worse. Visit your care team for regular checks on your progress. Because it may take several weeks to see the full effects of this medication, it is important to continue your treatment as prescribed by your care team. Patients and their families should watch out for new or worsening thoughts of suicide or depression. Also watch out for sudden changes in feelings such as feeling anxious, agitated, panicky, irritable, hostile, aggressive, impulsive, severely restless, overly excited and hyperactive, or not being able to sleep. If this happens, especially at the beginning of treatment or after a change in dose, call your care team. This medication may affect your coordination, reaction time, or judgment. Do not drive or operate machinery until you know how this medication affects you. Sit or stand up slowly to reduce the risk of dizzy or fainting spells. Drinking alcohol with this medication can increase the risk of these side effects. Your mouth may get dry. Chewing sugarless gum or sucking hard candy, and drinking plenty of water may help. Contact your care team if the problem does not go away or is severe. What side effects may I notice from receiving this medication? Side effects that you should report to your care team as soon as possible: Allergic reactions--skin rash, itching, hives, swelling of the face, lips, tongue, or throat Bleeding--bloody or black, tar-like stools, red or dark brown urine, vomiting blood or brown material that looks like coffee  grounds, small red or purple spots on skin, unusual bleeding or bruising Heart rhythm changes--fast or irregular heartbeat, dizziness, feeling faint or lightheaded, chest pain, trouble breathing Low sodium level--muscle weakness, fatigue, dizziness, headache, confusion Serotonin syndrome--irritability, confusion, fast or irregular heartbeat, muscle stiffness, twitching muscles, sweating, high fever, seizure, chills, vomiting, diarrhea Sudden eye pain or change in vision such as blurred vision, seeing halos around lights, vision loss Thoughts of suicide or self-harm, worsening mood Side effects that usually do not require medical attention (report these to your care team if they continue or are bothersome): Change in sex drive or performance Diarrhea Excessive sweating Nausea Tremors or shaking Upset stomach This list may not describe all possible side effects. Call your doctor for medical advice about side effects. You may report side effects to FDA at 1-800-FDA-1088. Where should I keep my medication? Keep out of the reach of children and pets. Store at room temperature between 15 and 30 degrees C (59 and 86 degrees F). Get rid of any unused medication after the expiration date. To get rid of medications that are no longer needed or expired: Take the medication to a medication take-back program. Check with your pharmacy or law enforcement to find a location. If you cannot return the medication, check the label or package insert to see if the medication should be thrown out in the garbage or flushed down the toilet. If you are not sure, ask your care team. If it is safe to put in the trash, empty the medication out of the container. Mix the medication with cat litter, dirt, coffee grounds, or other unwanted substance. Seal the mixture in a bag or container. Put it in the trash. NOTE: This sheet is a summary. It may not  cover all possible information. If you have questions about this medicine,  talk to your doctor, pharmacist, or health care provider.  2022 Elsevier/Gold Standard (2021-03-04 00:00:00)

## 2021-10-02 ENCOUNTER — Ambulatory Visit
Admission: RE | Admit: 2021-10-02 | Discharge: 2021-10-02 | Disposition: A | Discharge status: Home / Self Care | Payer: 59 | Source: Ambulatory Visit | Attending: Family | Admitting: Family

## 2021-10-02 ENCOUNTER — Telehealth: Payer: Self-pay | Admitting: *Deleted

## 2021-10-02 DIAGNOSIS — G44319 Acute post-traumatic headache, not intractable: Secondary | ICD-10-CM

## 2021-10-02 NOTE — Telephone Encounter (Signed)
Noted will re-evaluate.

## 2021-10-02 NOTE — Telephone Encounter (Signed)
Jenish with [facility] called and stated that they received the Paperwork on 1/16 that you filled out but they need clarification:  1.) Question 1-Need to know duration of condition; How long anticipated treatment for condition.   2.) Question 8-Provider indicated NO-stating patient's condition will not cause illness or flare up preventing working. However, Patient reported Multiple Absences due to ilness starting 08/30/21-09/05/21. Please estimate # of illness flares caused by condition and how long each illness Angel Chavez last. (Example:4 flare up episodes per 1 week lasting 2 days per episodes)  Stated that she will be faxing back the paperwork for changes. Stated to initial and date each change and fax back to Fax: [phone].

## 2021-10-03 NOTE — Telephone Encounter (Signed)
Called and spoke with Morrie Sheldon at [facility] and she is going to fax [place] a copy of the FMLA paperwork to correct.  Our copy has been sent to scanning.

## 2021-10-04 NOTE — Telephone Encounter (Signed)
Received FMLA Paperwork and placed in Dinah's folder for Clarification.   To be faxed back to [facility] Fax: [phone]

## 2021-10-07 ENCOUNTER — Ambulatory Visit (INDEPENDENT_AMBULATORY_CARE_PROVIDER_SITE_OTHER): Payer: 59 | Admitting: Family

## 2021-10-07 ENCOUNTER — Encounter: Payer: Self-pay | Admitting: Family

## 2021-10-07 ENCOUNTER — Other Ambulatory Visit: Payer: Self-pay

## 2021-10-07 VITALS — BP 140/101 | HR 90 | Temp 97.6°F | Ht 69.0 in | Wt 164.0 lb

## 2021-10-07 DIAGNOSIS — I10 Essential (primary) hypertension: Secondary | ICD-10-CM | POA: Diagnosis not present

## 2021-10-07 DIAGNOSIS — Z01419 Encounter for gynecological examination (general) (routine) without abnormal findings: Secondary | ICD-10-CM

## 2021-10-07 DIAGNOSIS — N921 Excessive and frequent menstruation with irregular cycle: Secondary | ICD-10-CM

## 2021-10-07 DIAGNOSIS — Z113 Encounter for screening for infections with a predominantly sexual mode of transmission: Secondary | ICD-10-CM | POA: Diagnosis not present

## 2021-10-07 DIAGNOSIS — Z1159 Encounter for screening for other viral diseases: Secondary | ICD-10-CM

## 2021-10-07 NOTE — Telephone Encounter (Signed)
FMLA paper work completed given to [place] to fax.

## 2021-10-07 NOTE — Telephone Encounter (Signed)
FMLA Paperwork Faxed back to [employer]: [phone]

## 2021-10-07 NOTE — Patient Instructions (Addendum)
Menorrhagia Menorrhagia is a form of abnormal uterine bleeding in which menstrual periods are heavy or last longer than normal. With menorrhagia, the periods may cause enough blood loss and cramping that a woman becomes unable to take part in her usual activities. What are the causes? Common causes of this condition include: Polyps or fibroids. These are noncancerous growths in the uterus. An imbalance of the hormones estrogen and progesterone. Anovulation, which occurs when one of the ovaries does not release an egg during one or more months. A problem with the thyroid gland (hypothyroidism). Side effects of having an intrauterine device (IUD). Side effects of some medicines, such as NSAIDs or blood thinners. A bleeding disorder that stops the blood from clotting normally. In some cases, the cause of this condition is not known. What increases the risk? You are more likely to develop this condition if you have cancer of the uterus. What are the signs or symptoms? Symptoms of this condition include: Routinely having to change your pad or tampon every 1-2 hours because it is soaked. Needing to use pads and tampons at the same time because of heavy bleeding. Needing to wake up to change your pads or tampons during the night. Passing blood clots larger than 1 inch (2.5 cm) in size. Having bleeding that lasts for more than 7 days. Having symptoms of low iron levels (anemia), such as tiredness (fatigue) or shortness of breath. How is this diagnosed? This condition may be diagnosed based on: A physical exam. Your symptoms and menstrual history. Tests, such as: Blood tests to check if you are pregnant or if you have hormonal changes, a bleeding or thyroid disorder, anemia, or other problems. Pap test to check for cancerous changes, infections, or inflammation. Endometrial biopsy. This test involves removing a tissue sample from the lining of the uterus (endometrium) to be examined under a  microscope. Pelvic ultrasound. This test uses sound waves to create images of your uterus, ovaries, and vagina. The images can show if you have fibroids or other growths. Hysteroscopy. For this test, a thin, flexible tube with a light on the end (hysteroscope) is used to look inside your uterus. How is this treated? Treatment may not be needed for this condition. If it is needed, the best treatment for you will depend on: Whether you need to prevent pregnancy. Your desire to have children in the future. The cause and severity of your bleeding. Your personal preference. Medicine Medicines are the first step in treatment. You may be treated with: Hormonal birth control methods. These treatments reduce bleeding during your menstrual period. They include: Birth control pills. Skin patch. Vaginal ring. Shots (injections) that you get every 3 months. Hormonal IUD. Implants that go under the skin. Medicines that thicken the blood and slow bleeding. Medicines that reduce swelling, such as ibuprofen. Medicines that contain an artificial (synthetic) hormone called progestin. Medicines that make the ovaries stop working for a short time. Iron supplements to treat anemia.  Surgery If medicines do not work, surgery may be done. Surgical options may include: Dilation and curettage (D&C). In this procedure, your health care provider opens the lowest part of the uterus (cervix) and then scrapes or suctions tissue from the endometrium. This reduces menstrual bleeding. Operative hysteroscopy. In this procedure, a hysteroscope is used to view your uterus and help remove polyps that may be causing heavy periods. Endometrial ablation. This is when various techniques are used to permanently destroy your entire endometrium. After endometrial ablation, most women have little  or no menstrual flow. This procedure reduces your ability to become pregnant. Endometrial resection. In this procedure, an electrosurgical  wire loop is used to remove the endometrium. This procedure reduces your ability to become pregnant. Hysterectomy. This is surgical removal of your uterus. This is a permanent procedure that stops menstrual periods. Pregnancy is not possible after a hysterectomy. Follow these instructions at home: Medicines Take over-the-counter and prescription medicines only as told by your health care provider. This includes iron pills. Do not change or switch medicines without asking your health care provider. Do not take aspirin or medicines that contain aspirin 1 week before or during your menstrual period. Aspirin may make bleeding worse. Managing constipation Your iron pills may cause constipation. If you are taking prescription iron supplements, you may need to take these actions to prevent or treat constipation: Drink enough fluid to keep your urine pale yellow. Take over-the-counter or prescription medicines. Eat foods that are high in fiber, such as beans, whole grains, and fresh fruits and vegetables. Limit foods that are high in fat and processed sugars, such as fried or sweet foods. General instructions If you need to change your sanitary pad or tampon more than once every 2 hours, limit your activity until the bleeding stops. Eat well-balanced meals, including foods that are high in iron. Foods that have a lot of iron include leafy green vegetables, meat, liver, eggs, and whole-grain breads and cereals. Do not try to lose weight until the abnormal bleeding has stopped and your blood iron level is back to normal. If you need to lose weight, work with your health care provider to lose weight safely. Keep all follow-up visits. This is important. Contact a health care provider if: You soak through a pad or tampon every 1 or 2 hours, and this happens every time you have a period. You need to use pads and tampons at the same time because you are bleeding so much. You have nausea, vomiting, diarrhea, or  other problems related to medicines you are taking. Get help right away if: You soak through more than a pad or tampon in 1 hour. You pass clots bigger than 1 inch (2.5 cm) wide. You feel short of breath. You feel like your heart is beating too fast. You feel dizzy or you faint. You feel very weak or tired. Summary Menorrhagia is a form of abnormal uterine bleeding in which menstrual periods are heavy or last longer than normal. Treatment may not be needed for this condition. If it is needed, it may include medicines or procedures. Take over-the-counter and prescription medicines only as told by your health care provider. This includes iron pills. Get help right away if you have heavy bleeding that soaks through more than a pad or tampon in 1 hour, you pass large clots, or you feel dizzy, short of breath, or very weak or tired. This information is not intended to replace advice given to you by your health care provider. Make sure you discuss any questions you have with your health care provider. Document Revised: 05/15/2020 Document Reviewed: 05/15/2020 Elsevier Patient Education  2022 Elsevier Inc.  Health Maintenance, Female Adopting a healthy lifestyle and getting preventive care are important in promoting health and wellness. Ask your health care provider about: The right schedule for you to have regular tests and exams. Things you can do on your own to prevent diseases and keep yourself healthy. What should I know about diet, weight, and exercise? Eat a healthy diet  Eat  a diet that includes plenty of vegetables, fruits, low-fat dairy products, and lean protein. Do not eat a lot of foods that are high in solid fats, added sugars, or sodium. Maintain a healthy weight Body mass index (BMI) is used to identify weight problems. It estimates body fat based on height and weight. Your health care provider can help determine your BMI and help you achieve or maintain a healthy weight. Get  regular exercise Get regular exercise. This is one of the most important things you can do for your health. Most adults should: Exercise for at least 150 minutes each week. The exercise should increase your heart rate and make you sweat (moderate-intensity exercise). Do strengthening exercises at least twice a week. This is in addition to the moderate-intensity exercise. Spend less time sitting. Even light physical activity can be beneficial. Watch cholesterol and blood lipids Have your blood tested for lipids and cholesterol at 39 years of age, then have this test every 5 years. Have your cholesterol levels checked more often if: Your lipid or cholesterol levels are high. You are older than 39 years of age. You are at high risk for heart disease. What should I know about cancer screening? Depending on your health history and family history, you may need to have cancer screening at various ages. This may include screening for: Breast cancer. Cervical cancer. Colorectal cancer. Skin cancer. Lung cancer. What should I know about heart disease, diabetes, and high blood pressure? Blood pressure and heart disease High blood pressure causes heart disease and increases the risk of stroke. This is more likely to develop in people who have high blood pressure readings or are overweight. Have your blood pressure checked: Every 3-5 years if you are 1818-39 years of age. Every year if you are 39 years old or older. Diabetes Have regular diabetes screenings. This checks your fasting blood sugar level. Have the screening done: Once every three years after age 39 if you are at a normal weight and have a low risk for diabetes. More often and at a younger age if you are overweight or have a high risk for diabetes. What should I know about preventing infection? Hepatitis B If you have a higher risk for hepatitis B, you should be screened for this virus. Talk with your health care provider to find out if  you are at risk for hepatitis B infection. Hepatitis C Testing is recommended for: Everyone born from [birth year] through 1963. Anyone with known risk factors for hepatitis C. Sexually transmitted infections (STIs) Get screened for STIs, including gonorrhea and chlamydia, if: You are sexually active and are younger than 39 years of age. You are older than 39 years of age and your health care provider tells you that you are at risk for this type of infection. Your sexual activity has changed since you were last screened, and you are at increased risk for chlamydia or gonorrhea. Ask your health care provider if you are at risk. Ask your health care provider about whether you are at high risk for HIV. Your health care provider may recommend a prescription medicine to help prevent HIV infection. If you choose to take medicine to prevent HIV, you should first get tested for HIV. You should then be tested every 3 months for as long as you are taking the medicine. Pregnancy If you are about to stop having your period (premenopausal) and you may become pregnant, seek counseling before you get pregnant. Take 400 to 800 micrograms (mcg) of  folic acid every day if you become pregnant. Ask for birth control (contraception) if you want to prevent pregnancy. Osteoporosis and menopause Osteoporosis is a disease in which the bones lose minerals and strength with aging. This can result in bone fractures. If you are 64 years old or older, or if you are at risk for osteoporosis and fractures, ask your health care provider if you should: Be screened for bone loss. Take a calcium or vitamin D supplement to lower your risk of fractures. Be given hormone replacement therapy (HRT) to treat symptoms of menopause. Follow these instructions at home: Alcohol use Do not drink alcohol if: Your health care provider tells you not to drink. You are pregnant, may be pregnant, or are planning to become pregnant. If you drink  alcohol: Limit how much you have to: 0-1 drink a day. Know how much alcohol is in your drink. In the [place] one drink equals one 12 oz bottle of beer (355 mL), one 5 oz glass of wine (148 mL), or one 1 oz glass of hard liquor (44 mL). Lifestyle Do not use any products that contain nicotine or tobacco. These products include cigarettes, chewing tobacco, and vaping devices, such as e-cigarettes. If you need help quitting, ask your health care provider. Do not use street drugs. Do not share needles. Ask your health care provider for help if you need support or information about quitting drugs. General instructions Schedule regular health, dental, and eye exams. Stay current with your vaccines. Tell your health care provider if: You often feel depressed. You have ever been abused or do not feel safe at home. Summary Adopting a healthy lifestyle and getting preventive care are important in promoting health and wellness. Follow your health care provider's instructions about healthy diet, exercising, and getting tested or screened for diseases. Follow your health care provider's instructions on monitoring your cholesterol and blood pressure. This information is not intended to replace advice given to you by your health care provider. Make sure you discuss any questions you have with your health care provider. Document Revised: 01/21/2021 Document Reviewed: 01/21/2021 Elsevier Patient Education  2022 [employer].
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
## 2021-10-08 LAB — PAP IG (IMAGE GUIDED)

## 2021-10-08 LAB — CBC WITH DIFFERENTIAL/PLATELET
Absolute Monocytes: 678 cells/uL (ref 200–950)
Basophils Absolute: 68 cells/uL (ref 0–200)
Basophils Relative: 1.2 %
Eosinophils Absolute: 80 cells/uL (ref 15–500)
Eosinophils Relative: 1.4 %
HCT: 42.9 % (ref 35.0–45.0)
Hemoglobin: 14.3 g/dL (ref 11.7–15.5)
Lymphs Abs: 2035 cells/uL (ref 850–3900)
MCH: 28.9 pg (ref 27.0–33.0)
MCHC: 33.3 g/dL (ref 32.0–36.0)
MCV: 86.8 fL (ref 80.0–100.0)
MPV: 13.5 fL — ABNORMAL HIGH (ref 7.5–12.5)
Monocytes Relative: 11.9 %
Neutro Abs: 2839 cells/uL (ref 1500–7800)
Neutrophils Relative %: 49.8 %
Platelets: 161 10*3/uL (ref 140–400)
RBC: 4.94 10*6/uL (ref 3.80–5.10)
RDW: 13.4 % (ref 11.0–15.0)
Total Lymphocyte: 35.7 %
WBC: 5.7 10*3/uL (ref 3.8–10.8)

## 2021-10-08 LAB — LIPID PANEL
Cholesterol: 158 mg/dL (ref <200)
HDL: 48 mg/dL — ABNORMAL LOW (ref >=50)
LDL Cholesterol (Calc): 94 mg/dL (calc)
Non-HDL Cholesterol (Calc): 110 mg/dL (calc) (ref <130)
Total CHOL/HDL Ratio: 3.3 (calc) (ref <5.0)
Triglycerides: 75 mg/dL (ref <150)

## 2021-10-08 LAB — COMPLETE METABOLIC PANEL WITH GFR
AG Ratio: 1.3 (calc) (ref 1.0–2.5)
ALT: 10 U/L (ref 6–29)
AST: 15 U/L (ref 10–30)
Albumin: 4.4 g/dL (ref 3.6–5.1)
Alkaline phosphatase (APISO): 58 U/L (ref 31–125)
BUN: 13 mg/dL (ref 7–25)
CO2: 32 mmol/L (ref 20–32)
Calcium: 9.6 mg/dL (ref 8.6–10.2)
Chloride: 102 mmol/L (ref 98–110)
Creat: 0.8 mg/dL (ref 0.50–0.97)
Globulin: 3.4 g/dL (calc) (ref 1.9–3.7)
Glucose, Bld: 90 mg/dL (ref 65–99)
Potassium: 3.4 mmol/L — ABNORMAL LOW (ref 3.5–5.3)
Sodium: 139 mmol/L (ref 135–146)
Total Bilirubin: 1.1 mg/dL (ref 0.2–1.2)
Total Protein: 7.8 g/dL (ref 6.1–8.1)
eGFR: 97 mL/min/{1.73_m2} (ref >=60)

## 2021-10-08 LAB — HIV ANTIBODY (ROUTINE TESTING W REFLEX): HIV 1&2 Ab, 4th Generation: NONREACTIVE

## 2021-10-08 LAB — HEPATITIS C ANTIBODY
Hepatitis C Ab: NONREACTIVE
SIGNAL TO CUT-OFF: 0.02 (ref <1.00)

## 2021-10-08 LAB — TSH: TSH: 1.2 mIU/L

## 2021-10-09 NOTE — Telephone Encounter (Signed)
Placed paperwork in Dinah's folder to review.

## 2021-10-10 ENCOUNTER — Other Ambulatory Visit: Payer: Self-pay

## 2021-10-10 ENCOUNTER — Encounter: Payer: Self-pay | Admitting: Obstetrics and Gynecology

## 2021-10-10 ENCOUNTER — Ambulatory Visit (INDEPENDENT_AMBULATORY_CARE_PROVIDER_SITE_OTHER): Payer: 59 | Admitting: Obstetrics and Gynecology

## 2021-10-10 ENCOUNTER — Other Ambulatory Visit (HOSPITAL_COMMUNITY)
Admission: RE | Admit: 2021-10-10 | Discharge: 2021-10-10 | Disposition: A | Discharge status: Home / Self Care | Payer: 59 | Source: Ambulatory Visit | Attending: Obstetrics and Gynecology | Admitting: Obstetrics and Gynecology

## 2021-10-10 VITALS — BP 124/78 | HR 99 | Ht 67.75 in | Wt 164.0 lb

## 2021-10-10 DIAGNOSIS — N946 Dysmenorrhea, unspecified: Secondary | ICD-10-CM | POA: Diagnosis not present

## 2021-10-10 DIAGNOSIS — N920 Excessive and frequent menstruation with regular cycle: Secondary | ICD-10-CM | POA: Diagnosis not present

## 2021-10-10 DIAGNOSIS — N979 Female infertility, unspecified: Secondary | ICD-10-CM

## 2021-10-10 DIAGNOSIS — R102 Pelvic and perineal pain: Secondary | ICD-10-CM | POA: Insufficient documentation

## 2021-10-10 DIAGNOSIS — Z113 Encounter for screening for infections with a predominantly sexual mode of transmission: Secondary | ICD-10-CM | POA: Insufficient documentation

## 2021-10-10 NOTE — Patient Instructions (Addendum)
Consider reading What to Expect Before Expecting.   Female Infertility Female infertility refers to a woman's inability to get pregnant (conceive) after a year of having sex regularly (or after 6 months in women over age 39) without using birth control. Infertility can also mean that a woman is not able to carry a pregnancy to full term. Both women and men can experience fertility problems. What are the causes? This condition may be caused by: Problems with reproductive organs. Infertility can result if a woman: Is not ovulating or is ovulating irregularly. Has a blockage or scarring in the fallopian tubes. Has uterine fibroids. This is a benign mass of tissue or muscle (tumor) that can develop in the uterus. Has an abnormally shaped uterus. Has an abnormally short cervix or a cervix that does not remain closed during a pregnancy. Certain medical conditions. These may include: Polycystic ovary syndrome (PCOS). This is a hormonal disorder that can cause small cysts to grow on the ovaries. This is the most common cause of infertility in women. Endometriosis. This is a condition in which the tissue that lines the uterus (endometrium) grows outside of its normal location. Cancer and cancer treatments, such as chemotherapy or radiation. Premature ovarian failure. This is when ovaries stop producing eggs and hormones before age 94. Sexually transmitted infections, such as chlamydia or gonorrhea. Autoimmune disorders. These are disorders in which the body's defense system (immune system) attacks normal, healthy cells. Infertility can be linked to more than one cause. For some women, the cause of infertility is not known (unexplained infertility). What increases the risk? The following factors may make you more likely to develop this condition: Age. A woman's fertility declines with age, especially after her mid-30s. Stress. Smoking. Being underweight or overweight. Drinking too much  alcohol. Using drugs such as anabolic steroids, cocaine, and marijuana. Exercising excessively. What are the signs or symptoms? The main sign of infertility in women is the inability to get pregnant or carry a pregnancy to full term. How is this diagnosed? This condition may be diagnosed by: Checking whether you are ovulating each month. The tests may include: Blood tests to check hormone levels. An ultrasound of the ovaries. Taking a sample of the tissue that lines the uterus and checking it under a microscope (endometrial biopsy). Doing additional tests. This is done if ovulation is normal. Tests may include: Hysterosalpingogram. This X-ray test can show the shape of the uterus and whether the fallopian tubes are open. Laparoscopy. This test uses a lighted tube (laparoscope) to look for problems in the fallopian tubes and other organs. Transvaginal ultrasound. This imaging test is used to check for abnormalities in the uterus and ovaries. Hysteroscopy. This test uses a lighted tube to check for problems in the cervix and the uterus. To be diagnosed with infertility, both partners will have a physical exam. Both partners will also have an extensive medical and sexual history taken. Additional tests may be done. How is this treated? Treatment depends on the cause of infertility. Most cases of infertility in women are treated with medicine or surgery. Women may take medicine to: Correct ovulation problems. Treat other health conditions. Surgery may be done to: Repair damage to the ovaries, fallopian tubes, cervix, or uterus. Remove growths from the uterus. Remove scar tissue from the uterus, pelvis, or other organs. Assisted reproductive technology (ART) Assisted reproductive technology (ART) refers to all treatments and procedures that combine eggs and sperm outside the body to try to help a couple conceive. ART  is often combined with fertility drugs to stimulate ovulation. Sometimes ART  is done using eggs retrieved from another woman's body (donor eggs) or from previously frozen fertilized eggs (embryos). There are different types of ART. These include: Intrauterine insemination (IUI). A long, thin tube is used to place sperm directly into a woman's uterus. This procedure: Is effective for infertility caused by sperm problems, including low sperm count and low motility. Can be used in combination with fertility drugs. In vitro fertilization (IVF). This is done when a woman's fallopian tubes are blocked or when a man has low sperm count. In this procedure: Fertility drugs are used to stimulate the ovaries to produce multiple eggs. Once mature, these eggs are removed from the body and combined with the sperm to be fertilized. The fertilized eggs are then placed into the woman's uterus. Follow these instructions at home: Lifestyle If you drink alcohol, limit how much you have to 0-1 drink a day. Do not use any products that contain nicotine or tobacco. These products include cigarettes, chewing tobacco, and vaping devices, such as e-cigarettes. If you need help quitting, ask your health care provider. Practice stress reduction techniques that work well for you, such as regular physical activity, meditation, or deep breathing. Make dietary changes to lose weight or maintain a healthy weight. Work with your health care provider and a dietitian to set a weight-loss goal that is healthy and reasonable for you. General instructions Take over-the-counter and prescription medicines only as told by your health care provider. Seek support from a counselor or support group to talk about your concerns related to infertility. Couples counseling may be helpful for you and your partner. Keep all follow-up visits. This is important. Where to find support Resolve - The National Infertility Association: resolve.org Contact a health care provider if: You feel that stress is interfering with your  life and relationships. You have side effects from treatments for infertility. Summary Female infertility refers to a woman's inability to get pregnant (conceive) after a year of having sex regularly (or after 6 months in women over age 53) without using birth control. To be diagnosed with infertility, both partners will have a physical exam. Both partners will also have an extensive medical and sexual history taken. Seek support from a counselor or support group to talk about your concerns related to infertility. Couples counseling may be helpful for you and your partner. This information is not intended to replace advice given to you by your health care provider. Make sure you discuss any questions you have with your health care provider. Document Revised: 10/30/2020 Document Reviewed: 10/30/2020 Elsevier Patient Education  2022 [employer].

## 2021-10-10 NOTE — Progress Notes (Signed)
GYNECOLOGY  VISIT   HPI: 41 y.o.   Single  African American  female   [id number] with Patient's last menstrual period was 09/25/2021 (exact date).   here for painful/heavy cycles x 3 years.  Menses every 27 - 28 days, lasting 5 days.  Pad change up to every 2 hours.  Can stain clothes.  Pain can wake her up in the middle of night.   She has had to call out from work.  When she applies pressure, sometimes it reduces the pain.  Takes ibuprofen 800 mg (Rx from [facility]) and this reduces the pain.  Tylenol does not help.   No bleeding in between periods.   Has pain when she is not on her period.   Hgb 14.3 on 10/07/21.  Trying for pregnancy off and on for 3 years.  Currently trying for about in 18 months.   She is interested in potential evaluation for fertility.   Her partner has fathered 4 children.   GYNECOLOGIC HISTORY: Patient's last menstrual period was 09/25/2021 (exact date). Contraception:  none--trying for pregnancy Menopausal hormone therapy:  n/a Last mammogram:  11-30-13 Diag.Bil/Neg/BiRads1/screening age  39 Last pap smear:  10-07-21 Neg. No abnormal paps        OB History     Gravida  1   Para      Term      Preterm      AB  1   Living         SAB      IAB  1   Ectopic      Multiple      Live Births                 Patient Active Problem List   Diagnosis Date Noted   Low back pain 06/08/2019   Tobacco abuse disorder 06/08/2019   Elevated blood pressure reading 06/08/2019    Past Medical History:  Diagnosis Date   Hypertension     History reviewed. No pertinent surgical history.  Current Outpatient Medications  Medication Sig Dispense Refill   cetirizine (ZYRTEC) 10 MG tablet Take 10 mg by mouth daily.     cyclobenzaprine (FLEXERIL) 10 MG tablet Take 10 mg by mouth 3 (three) times daily as needed for muscle spasms.     hydrochlorothiazide (HYDRODIURIL) 12.5 MG tablet Take 25 mg by mouth daily.     ibuprofen (ADVIL) 800 MG tablet Take  800 mg by mouth as needed.     losartan (COZAAR) 25 MG tablet Take 1 tablet (25 mg total) by mouth daily. 90 tablet 0   No current facility-administered medications for this visit.     ALLERGIES: Patient has no known allergies.  Family History  Problem Relation Age of Onset   Healthy Mother    Diabetes Father    Heart attack Father    Congestive Heart Failure Father    Cancer Paternal Aunt        breast cancer   Hypertension Maternal Grandmother    Cancer Maternal Grandmother        colon cancer   Arthritis Maternal Grandmother    Dementia Maternal Grandfather    Dementia Paternal Grandmother    Cancer Paternal Grandfather     Social History   Socioeconomic History   Marital status: Single    Spouse name: Not on file   Number of children: Not on file   Years of education: Not on file   Highest education level: Not on  file  Occupational History   Not on file  Tobacco Use   Smoking status: Every Day    Types: Cigars   Smokeless tobacco: Current   Tobacco comments:    .5 PPD  Vaping Use   Vaping Use: Never used  Substance and Sexual Activity   Alcohol use: Yes    Alcohol/week: 4.0 standard drinks    Types: 4 Standard drinks or equivalent per week   Drug use: Yes    Types: Marijuana   Sexual activity: Yes    Birth control/protection: None    Comment: trying for pregnancy  Other Topics Concern   Not on file  Social History Narrative   Diet:       Caffeine: Pepsi/ tea      Married, if yes what year: Single      Do you live in a house, apartment, assisted living, condo, trailer, ect: Apartment      Is it one or more stories: 3 stories      How many persons live in your home? 4      Pets: dog      Highest level or education completed: some college      Current/Past profession: customer service      Exercise: sometimes                 Type and how often: walking (warm months)         Living Will: No   DNR: No   POA/HPOA: No      Functional  Status:   Do you have difficulty bathing or dressing yourself?   Do you have difficulty preparing food or eating?   Do you have difficulty managing your medications?   Do you have difficulty managing your finances?   Do you have difficulty affording your medications?   Social Determinants of Health   Financial Resource Strain: Not on file  Food Insecurity: Not on file  Transportation Needs: Not on file  Physical Activity: Not on file  Stress: Not on file  Social Connections: Not on file  Intimate Partner Violence: Not on file    Review of Systems  Genitourinary:  Positive for menstrual problem (painful/heavy menses).  All other systems reviewed and are negative.  PHYSICAL EXAMINATION:    BP 124/78    Pulse 99    Ht 5' 7.75" (1.721 m)    Wt 164 lb (74.4 kg)    LMP 09/25/2021 (Exact Date)    SpO2 97%    BMI 25.12 kg/m     General appearance: alert, cooperative and appears stated age Head: Normocephalic, without obvious abnormality, atraumatic Lungs: clear to auscultation bilaterally Heart: regular rate and rhythm Abdomen: soft, non-tender, no masses,  no organomegaly No abnormal inguinal nodes palpated Neurologic: Grossly normal  Pelvic: External genitalia:  no lesions              Urethra:  normal appearing urethra with no masses, tenderness or lesions              Bartholins and Skenes: normal                 Vagina: normal appearing vagina with normal color and discharge, no lesions              Cervix: no lesions                Bimanual Exam:  Uterus:  normal size, contour, position, consistency, mobility, non-tender  Adnexa: no mass, fullness, tenderness              Rectal exam: yes.  Confirms.              Anus:  normal sphincter tone, no lesions  Chaperone was present for exam:  Estill Bamberg, CMA  ASSESSMENT  Dysmenorrhea.  Pelvic pain.  Menorrhagia.  AMA status. Desire for pregnancy. Smoker.  Marijuana use.  HTN.  PLAN  Start PNV.  See PCP to  switch off Losartan and consider Labetolol. Avoid tobacco, marijuana and ETOH and other unnecessary exposures. We discussed AMA status and increased pregnancy risks.  Prepregnancy reading suggested.  Progesterone on day #21.  She may also do an ovulation kit at home. Consent form for SA of partner. Testing for GC/CT/trichomonas. Return for pelvic US. HSG to follow at later time.    An After Visit Summary was printed and given to the patient.

## 2021-10-11 ENCOUNTER — Other Ambulatory Visit: Payer: Self-pay

## 2021-10-11 DIAGNOSIS — E876 Hypokalemia: Secondary | ICD-10-CM

## 2021-10-11 LAB — CERVICOVAGINAL ANCILLARY ONLY
Chlamydia: NEGATIVE
Comment: NEGATIVE
Comment: NEGATIVE
Comment: NORMAL
Neisseria Gonorrhea: NEGATIVE
Trichomonas: POSITIVE — AB

## 2021-10-13 ENCOUNTER — Encounter: Payer: Self-pay | Admitting: Obstetrics and Gynecology

## 2021-10-14 ENCOUNTER — Other Ambulatory Visit: Payer: Self-pay

## 2021-10-14 MED ORDER — METRONIDAZOLE 500 MG PO TABS: 500.0000 mg | ORAL_TABLET | Freq: Two times a day (BID) | ORAL | 0 refills | Status: DC

## 2021-10-21 ENCOUNTER — Other Ambulatory Visit: Payer: Self-pay

## 2021-10-21 ENCOUNTER — Other Ambulatory Visit: Payer: 59

## 2021-10-21 DIAGNOSIS — E876 Hypokalemia: Secondary | ICD-10-CM

## 2021-10-21 LAB — BASIC METABOLIC PANEL WITH GFR
BUN: 16 mg/dL (ref 7–25)
CO2: 27 mmol/L (ref 20–32)
Calcium: 8.9 mg/dL (ref 8.6–10.2)
Chloride: 106 mmol/L (ref 98–110)
Creat: 0.95 mg/dL (ref 0.50–0.97)
Glucose, Bld: 92 mg/dL (ref 65–139)
Potassium: 4 mmol/L (ref 3.5–5.3)
Sodium: 139 mmol/L (ref 135–146)
eGFR: 79 mL/min/{1.73_m2} (ref >=60)

## 2021-10-23 MED ORDER — HYDROCHLOROTHIAZIDE 12.5 MG PO TABS: 25.0000 mg | ORAL_TABLET | Freq: Every day | ORAL | 3 refills | Status: DC

## 2021-10-23 NOTE — Telephone Encounter (Signed)
Pended Rx and sent to Dinah for approval.  ?

## 2021-11-07 ENCOUNTER — Other Ambulatory Visit: Payer: 59 | Admitting: Obstetrics and Gynecology

## 2021-11-07 ENCOUNTER — Other Ambulatory Visit: Payer: 59

## 2021-12-04 ENCOUNTER — Ambulatory Visit: Payer: 59 | Admitting: Obstetrics and Gynecology

## 2021-12-09 ENCOUNTER — Ambulatory Visit (INDEPENDENT_AMBULATORY_CARE_PROVIDER_SITE_OTHER): Payer: 59 | Admitting: Obstetrics and Gynecology

## 2021-12-09 ENCOUNTER — Encounter: Payer: Self-pay | Admitting: Obstetrics and Gynecology

## 2021-12-09 ENCOUNTER — Other Ambulatory Visit: Payer: Self-pay

## 2021-12-09 VITALS — BP 128/94 | Ht 67.75 in | Wt 164.0 lb

## 2021-12-09 DIAGNOSIS — I10 Essential (primary) hypertension: Secondary | ICD-10-CM | POA: Diagnosis not present

## 2021-12-09 DIAGNOSIS — Z319 Encounter for procreative management, unspecified: Secondary | ICD-10-CM

## 2021-12-09 DIAGNOSIS — A599 Trichomoniasis, unspecified: Secondary | ICD-10-CM | POA: Diagnosis not present

## 2021-12-09 NOTE — Progress Notes (Signed)
GYNECOLOGY  VISIT ?  ?HPI: ?41 y.o.   Single  African American  female   ?[id number] with Patient's last menstrual period was 11/20/2021 (exact date).   ?here for test of cure of trichomonas.  ? ?Patient took her Flagyl and not sure if her partner took his medication.  ? ?No vaginal discharge, burning or odor.  ? ?She is wanting to test her ovulation.  ?Her cycles are 28 days.  ? ?Working on stopping smoking.  ? ?Taking PNV.  ? ?Feels really good to be working her her fertility. ? ?Has not done SA test and HSG test to date.  ? ?GYNECOLOGIC HISTORY: ?Patient's last menstrual period was 11/20/2021 (exact date). ?Contraception:  None ?Menopausal hormone therapy:  n/a ?Last mammogram:  Diag.Bil/Neg/Birads1 ?Last pap smear:  10-07-21- Neg ?       ?OB History   ? ? Gravida  ?1  ? Para  ?   ? Term  ?   ? Preterm  ?   ? AB  ?1  ? Living  ?   ?  ? ? SAB  ?   ? IAB  ?1  ? Ectopic  ?   ? Multiple  ?   ? Live Births  ?   ?   ?  ?  ?    ? ?Patient Active Problem List  ? Diagnosis Date Noted  ? Low back pain 06/08/2019  ? Tobacco abuse disorder 06/08/2019  ? Elevated blood pressure reading 06/08/2019  ? ? ?Past Medical History:  ?Diagnosis Date  ? Hypertension   ? Trichomonas infection 2023  ? ? ?History reviewed. No pertinent surgical history. ? ?Current Outpatient Medications  ?Medication Sig Dispense Refill  ? cetirizine (ZYRTEC) 10 MG tablet Take 10 mg by mouth daily.    ? cyclobenzaprine (FLEXERIL) 10 MG tablet Take 10 mg by mouth 3 (three) times daily as needed for muscle spasms.    ? hydrochlorothiazide (HYDRODIURIL) 12.5 MG tablet Take 2 tablets (25 mg total) by mouth daily. 60 tablet 3  ? ibuprofen (ADVIL) 800 MG tablet Take 800 mg by mouth as needed.    ? losartan (COZAAR) 25 MG tablet Take 1 tablet (25 mg total) by mouth daily. 90 tablet 0  ? ?No current facility-administered medications for this visit.  ?  ? ?ALLERGIES: Patient has no known allergies. ? ?Family History  ?Problem Relation Age of Onset  ? Healthy Mother   ?  Diabetes Father   ? Heart attack Father   ? Congestive Heart Failure Father   ? Cancer Paternal Aunt   ?     breast cancer  ? Hypertension Maternal Grandmother   ? Cancer Maternal Grandmother   ?     colon cancer  ? Arthritis Maternal Grandmother   ? Dementia Maternal Grandfather   ? Dementia Paternal Grandmother   ? Cancer Paternal Grandfather   ? ? ?Social History  ? ?Socioeconomic History  ? Marital status: Single  ?  Spouse name: Not on file  ? Number of children: Not on file  ? Years of education: Not on file  ? Highest education level: Not on file  ?Occupational History  ? Not on file  ?Tobacco Use  ? Smoking status: Every Day  ?  Types: Cigars  ? Smokeless tobacco: Current  ? Tobacco comments:  ?  .5 PPD  ?Vaping Use  ? Vaping Use: Never used  ?Substance and Sexual Activity  ? Alcohol use: Yes  ?  Alcohol/week: 4.0  standard drinks  ?  Types: 4 Standard drinks or equivalent per week  ? Drug use: Yes  ?  Types: Marijuana  ? Sexual activity: Yes  ?  Birth control/protection: None  ?  Comment: trying for pregnancy  ?Other Topics Concern  ? Not on file  ?Social History Narrative  ? Diet:   ?   ? Caffeine: Pepsi/ tea  ?   ? Married, if yes what year: Single  ?   ? Do you live in a house, apartment, assisted living, condo, trailer, ect: Apartment  ?   ? Is it one or more stories: 3 stories  ?   ? How many persons live in your home? 4  ?   ? Pets: dog  ?   ? Highest level or education completed: some college  ?   ? Current/Past profession: customer service  ?   ? Exercise: sometimes                 Type and how often: walking (warm months)  ?   ?   ? Living Will: No  ? DNR: No  ? POA/HPOA: No  ?   ? Functional Status:  ? Do you have difficulty bathing or dressing yourself?  ? Do you have difficulty preparing food or eating?  ? Do you have difficulty managing your medications?  ? Do you have difficulty managing your finances?  ? Do you have difficulty affording your medications?  ? ?Social Determinants of Health   ? ?Financial Resource Strain: Not on file  ?Food Insecurity: Not on file  ?Transportation Needs: Not on file  ?Physical Activity: Not on file  ?Stress: Not on file  ?Social Connections: Not on file  ?Intimate Partner Violence: Not on file  ? ? ?Review of Systems  ?All other systems reviewed and are negative. ? ?PHYSICAL EXAMINATION:   ? ?BP (!) 128/94   Ht 5' 7.75" (1.721 m)   Wt 164 lb (74.4 kg)   LMP 11/20/2021 (Exact Date)   BMI 25.12 kg/m?     ?General appearance: alert, cooperative and appears stated age ? ?Pelvic: External genitalia:  no lesions ?             Urethra:  normal appearing urethra with no masses, tenderness or lesions ?             Bartholins and Skenes: normal    ?             Vagina: normal appearing vagina with normal color and discharge, no lesions ?             Cervix: no lesions ?               ?Bimanual Exam:  Uterus:  normal size, contour, position, consistency, mobility, non-tender ?             Adnexa: no mass, fullness, tenderness ?            ? ?Chaperone was present for exam:  yes. ? ?ASSESSMENT ? ?Trichomonas infection.  ?Desire for fertility.  ?HTN.  ? ?PLAN ? ?Trichomonas recheck.  ?We discussed ovulation kits to test for [facility] surge, which occurs just prior to ovulation.  ?She will re-approach her partner about semen analysis when she is ready to do so.  ?She will see her PCP regarding her antiHTN meds and switch to beta blocker.  ?Fu prn.  ?  ?An After Visit Summary was printed and given to the patient. ? ? ? ? ?

## 2021-12-10 LAB — TRICHOMONAS VAGINALIS, PROBE AMP: Trichomonas vaginalis RNA: NOT DETECTED

## 2021-12-25 ENCOUNTER — Other Ambulatory Visit: Payer: Self-pay | Admitting: Family

## 2021-12-25 DIAGNOSIS — I10 Essential (primary) hypertension: Secondary | ICD-10-CM
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
## 2022-10-09 ENCOUNTER — Ambulatory Visit (INDEPENDENT_AMBULATORY_CARE_PROVIDER_SITE_OTHER): Payer: No Typology Code available for payment source | Admitting: Family

## 2022-10-09 ENCOUNTER — Encounter: Payer: Self-pay | Admitting: Family

## 2022-10-09 VITALS — BP 200/160 | HR 96 | Temp 97.6°F | Resp 16 | Ht 67.75 in | Wt 158.2 lb

## 2022-10-09 DIAGNOSIS — N921 Excessive and frequent menstruation with irregular cycle: Secondary | ICD-10-CM

## 2022-10-09 DIAGNOSIS — I10 Essential (primary) hypertension: Secondary | ICD-10-CM

## 2022-10-09 DIAGNOSIS — F411 Generalized anxiety disorder: Secondary | ICD-10-CM | POA: Diagnosis not present

## 2022-10-09 MED ORDER — LOSARTAN POTASSIUM 25 MG PO TABS: 25.0000 mg | ORAL_TABLET | Freq: Every day | ORAL | 1 refills | Status: DC

## 2022-10-09 MED ORDER — CLONIDINE HCL 0.1 MG PO TABS
0.1000 mg | ORAL_TABLET | Freq: Once | ORAL | Status: AC
  Administered 2022-10-09: 0.1 mg via ORAL

## 2022-10-09 MED ORDER — HYDROCHLOROTHIAZIDE 12.5 MG PO TABS: 25.0000 mg | ORAL_TABLET | Freq: Every day | ORAL | 1 refills | Status: DC

## 2022-10-09 NOTE — Progress Notes (Signed)
Provider: Richarda Blade FNP-C   Shalamar Plourde, Donalee Citrin, NP  Patient Care Team: Tracie Lindbloom, Donalee Citrin, NP as PCP - General (Family Medicine)  Extended Emergency Contact Information Primary Emergency Contact: Edsall,Shirley Address: [address]          [place] [address] [place] Home Phone: [phone] Relation: Mother  Code Status:  Full Code  Goals of care: Advanced Directive information    10/09/2022    9:41 AM  Advanced Directives  Does Patient Have a Medical Advance Directive? No  Would patient like information on creating a medical advance directive? No - Patient declined     Chief Complaint  Patient presents with   Annual Exam    PHYSICAL   Health Maintenance    Discuss the need for Pap smear.    Immunizations    Discuss the need for DTAP vaccine, Influenza vaccine, and Covid Booster.    HPI:  Pt is a 42 y.o. female seen today for Annual Physical Exam and medical management of chronic diseases.  Blood pressure very high this visit.states has been out of the medication for the past 6 months.she did not call for refills.No form of exercise since she works at home.  Tends to get overwhelmed at times but coping well.  Continues to smoke cigarettes states smoked yes on the way to the office today.  FMLA for chronic headache and lower back pain.states had to sit at the computer for 8 hrs. She will bring FMLA paper work to be filled.   States will reschedule for pap smear since she is on her period. States periods still heavy.Has seen Gynecologist but did not address her issues so did not go back. Menses last 5 days and sometimes irregular goes about 30 days before.   Past Medical History:  Diagnosis Date   Hypertension    Trichomonas infection 2023   History reviewed. No pertinent surgical history.  No Known Allergies  Allergies as of 10/09/2022   No Known Allergies      Medication List        Accurate as of October 09, 2022 10:18 AM. If you  have any questions, ask your nurse or doctor.          cetirizine 10 MG tablet Commonly known as: ZYRTEC Take 10 mg by mouth daily.   cyclobenzaprine 10 MG tablet Commonly known as: FLEXERIL Take 10 mg by mouth 3 (three) times daily as needed for muscle spasms.   fluticasone 50 MCG/ACT nasal spray Commonly known as: FLONASE Place 2 sprays into both nostrils daily.   hydrochlorothiazide 12.5 MG tablet Commonly known as: HYDRODIURIL Take 2 tablets (25 mg total) by mouth daily.   ibuprofen 800 MG tablet Commonly known as: ADVIL Take 800 mg by mouth as needed.   losartan 25 MG tablet Commonly known as: COZAAR Take 1 tablet by mouth once daily        Review of Systems  Constitutional:  Negative for appetite change, chills, fatigue, fever and unexpected weight change.  HENT:  Negative for congestion, dental problem, ear discharge, ear pain, facial swelling, hearing loss, nosebleeds, postnasal drip, rhinorrhea, sinus pressure, sinus pain, sneezing, sore throat, tinnitus and trouble swallowing.   Eyes:  Negative for pain, discharge, redness, itching and visual disturbance.  Respiratory:  Negative for cough, chest tightness, shortness of breath and wheezing.   Cardiovascular:  Negative for chest pain, palpitations and leg swelling.  Gastrointestinal:  Negative for abdominal distention, abdominal pain, blood in stool,  constipation, diarrhea, nausea and vomiting.  Endocrine: Negative for cold intolerance, heat intolerance, polydipsia, polyphagia and polyuria.  Genitourinary:  Negative for difficulty urinating, dysuria, flank pain, frequency and urgency.  Musculoskeletal:  Negative for arthralgias, back pain, gait problem, joint swelling, myalgias, neck pain and neck stiffness.  Skin:  Negative for color change, pallor, rash and wound.  Neurological:  Negative for dizziness, syncope, speech difficulty, weakness, light-headedness, numbness and headaches.  Hematological:  Does not  bruise/bleed easily.  Psychiatric/Behavioral:  Negative for agitation, behavioral problems, confusion, hallucinations, self-injury, sleep disturbance and suicidal ideas. The patient is not nervous/anxious.     Immunization History  Administered Date(s) Administered   PFIZER(Purple Top)SARS-COV-2 Vaccination 12/26/2019, 01/16/2020   PPD Test 04/25/2016, 05/02/2016   Pertinent  Health Maintenance Due  Topic Date Due   PAP SMEAR-Modifier  Never done   INFLUENZA VACCINE  Never done      06/13/2019   11:16 AM 08/30/2021    9:23 AM 09/27/2021   10:00 AM 10/07/2021    9:13 AM 10/09/2022    9:41 AM  Fall Risk  Falls in the past year? 0  0 0 0  Was there an injury with Fall?   0 0 0  Fall Risk Category Calculator   0 0 0  Fall Risk Category (Retired)   Low Low   (RETIRED) Patient Fall Risk Level Low fall risk Low fall risk Low fall risk Low fall risk   Patient at Risk for Falls Due to   No Fall Risks No Fall Risks No Fall Risks  Fall risk Follow up   Falls evaluation completed Falls evaluation completed Falls evaluation completed   Functional Status Survey:    Vitals:   10/09/22 0942 10/09/22 0949  BP: (!) 180/130 (!) 200/140  Pulse: 96   Resp: 16   Temp: 97.6 F (36.4 C)   SpO2: 98%   Weight: 158 lb 3.2 oz (71.8 kg)   Height: 5' 7.75" (1.721 m)    Body mass index is 24.23 kg/m. Physical Exam Vitals reviewed.  Constitutional:      General: She is not in acute distress.    Appearance: Normal appearance. She is normal weight. She is not ill-appearing or diaphoretic.  HENT:     Head: Normocephalic.     Right Ear: Tympanic membrane, ear canal and external ear normal. There is no impacted cerumen.     Left Ear: Tympanic membrane, ear canal and external ear normal. There is no impacted cerumen.     Nose: Nose normal. No congestion or rhinorrhea.     Mouth/Throat:     Mouth: Mucous membranes are moist.     Pharynx: Oropharynx is clear. No oropharyngeal exudate or posterior  oropharyngeal erythema.  Eyes:     General: No scleral icterus.       Right eye: No discharge.        Left eye: No discharge.     Extraocular Movements: Extraocular movements intact.     Conjunctiva/sclera: Conjunctivae normal.     Pupils: Pupils are equal, round, and reactive to light.  Neck:     Vascular: No carotid bruit.  Cardiovascular:     Rate and Rhythm: Normal rate and regular rhythm.     Pulses: Normal pulses.     Heart sounds: Normal heart sounds. No murmur heard.    No friction rub. No gallop.  Pulmonary:     Effort: Pulmonary effort is normal. No respiratory distress.     Breath sounds: Normal  breath sounds. No wheezing, rhonchi or rales.  Chest:     Chest wall: No tenderness.  Abdominal:     General: Bowel sounds are normal. There is no distension.     Palpations: Abdomen is soft. There is no mass.     Tenderness: There is no abdominal tenderness. There is no right CVA tenderness, left CVA tenderness, guarding or rebound.  Musculoskeletal:        General: No swelling or tenderness. Normal range of motion.     Cervical back: Normal range of motion. No rigidity or tenderness.     Right lower leg: No edema.     Left lower leg: No edema.  Lymphadenopathy:     Cervical: No cervical adenopathy.  Skin:    General: Skin is warm and dry.     Coloration: Skin is not pale.     Findings: No bruising, erythema, lesion or rash.  Neurological:     Mental Status: She is alert and oriented to person, place, and time.     Cranial Nerves: No cranial nerve deficit.     Sensory: No sensory deficit.     Motor: No weakness.     Coordination: Coordination normal.     Gait: Gait normal.  Psychiatric:        Mood and Affect: Mood normal.        Speech: Speech normal.        Behavior: Behavior normal.        Thought Content: Thought content normal.        Judgment: Judgment normal.     Labs reviewed: Recent Labs    10/21/21 0901  NA 139  K 4.0  CL 106  CO2 27  GLUCOSE 92   BUN 16  CREATININE 0.95  CALCIUM 8.9   No results for input(s): "AST", "ALT", "ALKPHOS", "BILITOT", "PROT", "ALBUMIN" in the last 8760 hours. No results for input(s): "WBC", "NEUTROABS", "HGB", "HCT", "MCV", "PLT" in the last 8760 hours. Lab Results  Component Value Date   TSH 1.20 10/07/2021   No results found for: "HGBA1C" Lab Results  Component Value Date   CHOL 158 10/07/2021   HDL 48 (L) 10/07/2021   LDLCALC 94 10/07/2021   TRIG 75 10/07/2021   CHOLHDL 3.3 10/07/2021    Significant Diagnostic Results in last 30 days:  No results found.  Assessment/Plan 1. Primary hypertension Blood pressure elevated today reports not taking medication for the past 6 months.  Clonidine administered.  She will restart hydrochlorothiazide and losartan. - cloNIDine (CATAPRES) tablet 0.1 mg - hydrochlorothiazide (HYDRODIURIL) 12.5 MG tablet; Take 2 tablets (25 mg total) by mouth daily.  Dispense: 90 tablet; Refill: 1 - losartan (COZAAR) 25 MG tablet; Take 1 tablet (25 mg total) by mouth daily.  Dispense: 90 tablet; Refill: 1 -Follow-up in 2 weeks to recheck blood pressure  2. Generalized anxiety disorder Chronic anxiety Not on any medication Continue to monitor  3. Metrorrhagia Reports heavy 5 days of period -Continue to continue to follow-up with a gynecologist  Family/ staff Communication: Reviewed plan of care with patient verbalized understanding  Labs/tests ordered: None   Next Appointment : Return in about 6 months (around 04/09/2023) for medical mangement of chronic issues. 2 weeks for b/p check and Pap smear .   Sandrea Hughs, NP

## 2022-10-22 ENCOUNTER — Encounter: Payer: Self-pay | Admitting: Family

## 2022-10-23 ENCOUNTER — Encounter: Payer: Self-pay | Admitting: Family

## 2022-10-23 ENCOUNTER — Ambulatory Visit (INDEPENDENT_AMBULATORY_CARE_PROVIDER_SITE_OTHER): Payer: No Typology Code available for payment source | Admitting: Family

## 2022-10-23 VITALS — BP 138/88 | HR 86 | Temp 97.3°F | Ht 67.75 in | Wt 161.0 lb

## 2022-10-23 DIAGNOSIS — Z124 Encounter for screening for malignant neoplasm of cervix: Secondary | ICD-10-CM | POA: Diagnosis not present

## 2022-10-23 DIAGNOSIS — I10 Essential (primary) hypertension: Secondary | ICD-10-CM

## 2022-10-23 LAB — HM PAP SMEAR: HM Pap smear: NORMAL

## 2022-10-23 MED ORDER — AMLODIPINE BESYLATE 5 MG PO TABS: 5.0000 mg | ORAL_TABLET | Freq: Every day | ORAL | 3 refills | Status: DC

## 2022-10-23 NOTE — Patient Instructions (Signed)
Smoking Tobacco Information, Adult Smoking tobacco can be harmful to your health. Tobacco contains a toxic colorless chemical called nicotine. Nicotine causes changes in your brain that make you want more and more. This is called addiction. This can make it hard to stop smoking once you start. Tobacco also has other toxic chemicals that can hurt your body and raise your risk of many cancers. Menthol or "lite" tobacco or cigarette brands are not safer than regular brands. How can smoking tobacco affect me? Smoking tobacco puts you at risk for: Cancer. Smoking is most commonly associated with lung cancer, but can also lead to cancer in other parts of the body. Chronic obstructive pulmonary disease (COPD). This is a long-term lung condition that makes it hard to breathe. It also gets worse over time. High blood pressure (hypertension), heart disease, stroke, heart attack, and lung infections, such as pneumonia. Cataracts. This is when the lenses in the eyes become clouded. Digestive problems. This may include peptic ulcers, heartburn, and gastroesophageal reflux disease (GERD). Oral health problems, such as gum disease, mouth sores, and tooth loss. Loss of taste and smell. Smoking also affects how you look and smell. Smoking may cause: Wrinkles. Yellow or stained teeth, fingers, and fingernails. Bad breath. Bad-smelling clothes and hair. Smoking tobacco can also affect your social life, because: It may be challenging to find places to smoke when away from home. Many workplaces, [employer], hotels, and public places are tobacco-free. Smoking is expensive. This is due to the cost of tobacco and the long-term costs of treating health problems from smoking. Secondhand smoke may affect those around you. Secondhand smoke can cause lung cancer, breathing problems, and heart disease. Children of smokers have a higher risk for: Sudden infant death syndrome (SIDS). Ear infections. Lung infections. What  actions can I take to prevent health problems? Quit smoking  Do not start smoking. Quit if you already smoke. Do not replace cigarette smoking with vaping devices, such as e-cigarettes. Make a plan to quit smoking and commit to it. Look for programs to help you, and ask your health care provider for recommendations and ideas. Set a date and write down all the reasons you want to quit. Let your friends and family know you are quitting so they can help and support you. Consider finding friends who also want to quit. It can be easier to quit with someone else, so that you can support each other. Talk with your health care provider about using nicotine replacement medicines to help you quit. These include gum, lozenges, patches, sprays, or pills. If you try to quit but return to smoking, stay positive. It is common to slip up when you first quit, so take it one day at a time. Be prepared for cravings. When you feel the urge to smoke, chew gum or suck on hard candy. Lifestyle Stay busy. Take care of your body. Get plenty of exercise, eat a healthy diet, and drink plenty of water. Find ways to manage your stress, such as meditation, yoga, exercise, or time spent with friends and family. Ask your health care provider about having regular tests (screenings) to check for cancer. This may include blood tests, imaging tests, and other tests. Where to find support To get support to quit smoking, consider: Asking your health care provider for more information and resources. Joining a support group for people who want to quit smoking in your local community. There are many effective programs that may help you to quit. Calling the smokefree.gov counselor  helpline at 1-800-QUIT-NOW (229)869-3181). Where to find more information You may find more information about quitting smoking from: Centers for Disease Control and Prevention: https://www.chang-huffman.com/ https://Liuzzi.com/: smokefree.gov American Lung Association:  freedomfromsmoking.org Contact a health care provider if: You have problems breathing. Your lips, nose, or fingers turn blue. You have chest pain. You are coughing up blood. You feel like you will faint. You have other health changes that cause you to worry. Summary Smoking tobacco can negatively affect your health, the health of those around you, your finances, and your social life. Do not start smoking. Quit if you already smoke. If you need help quitting, ask your health care provider. Consider joining a support group for people in your local community who want to quit smoking. There are many effective programs that may help you to quit. This information is not intended to replace advice given to you by your health care provider. Make sure you discuss any questions you have with your health care provider. Document Revised: 08/27/2021 Document Reviewed: 08/27/2021 Elsevier Patient Education  [place].

## 2022-10-23 NOTE — Progress Notes (Signed)
Provider: Marlowe Sax FNP-C  Annaleese Guier, Nelda Bucks, NP  Patient Care Team: Shandon Burlingame, Nelda Bucks, NP as PCP - General (Family Medicine)  Extended Emergency Contact Information Primary Emergency Contact: Boerema,Shirley Address: [address]          [place] [address] Johnnette Litter of [place] Phone: [phone] Relation: Mother  Code Status:  Full Code  Goals of care: Advanced Directive information    10/23/2022    9:04 AM  Advanced Directives  Does Patient Have a Medical Advance Directive? No  Would patient like information on creating a medical advance directive? No - Patient declined     Chief Complaint  Patient presents with   Follow-up    2 week follow up on blood pressure follow-up an pap smear. Discuss need for td/tdap and additional covid boosters or post pone if patient refuses. [facility] verified. Patient denies receiving any vaccines since last visit.      HPI:  Pt is a 42 y.o. female seen today for an acute visit for 2 weeks follow up for high blood pressure.B/p was 200/160 clonidine was given and advised to restart her blood pressure medication which she had not taken in the past 6 months. Home B/p readings in the 140's/100's - 190's/120's-130's with complains of headache when b/p are high.HR 60's-80's with x 1 90's. Also here for a pap smear.denies any abdominal pain ,vaginal bleeding or discharge.   Past Medical History:  Diagnosis Date   Hypertension    Trichomonas infection 2023   History reviewed. No pertinent surgical history.  No Known Allergies  Outpatient Encounter Medications as of 10/23/2022  Medication Sig   cetirizine (ZYRTEC) 10 MG tablet Take 10 mg by mouth daily.   cyclobenzaprine (FLEXERIL) 10 MG tablet Take 10 mg by mouth 3 (three) times daily as needed for muscle spasms.   fluticasone (FLONASE) 50 MCG/ACT nasal spray Place 2 sprays into both nostrils daily.   hydrochlorothiazide (HYDRODIURIL) 12.5 MG tablet Take 2 tablets (25 mg total) by  mouth daily.   ibuprofen (ADVIL) 200 MG tablet Take 400 mg by mouth as needed.   losartan (COZAAR) 25 MG tablet Take 1 tablet (25 mg total) by mouth daily.   [DISCONTINUED] ibuprofen (ADVIL) 800 MG tablet Take 800 mg by mouth as needed. (Patient not taking: Reported on 10/23/2022)   No facility-administered encounter medications on file as of 10/23/2022.    Review of Systems  Constitutional:  Negative for appetite change, chills, fatigue, fever and unexpected weight change.  Eyes:  Negative for pain, discharge, redness, itching and visual disturbance.  Respiratory:  Negative for cough, chest tightness, shortness of breath and wheezing.   Cardiovascular:  Negative for chest pain, palpitations and leg swelling.  Gastrointestinal:  Negative for abdominal distention, abdominal pain, constipation, diarrhea, nausea and vomiting.  Genitourinary:  Negative for difficulty urinating, dysuria, flank pain, frequency and urgency.  Musculoskeletal:  Negative for arthralgias, back pain, gait problem, joint swelling and myalgias.  Skin:  Negative for color change, pallor and rash.  Neurological:  Negative for dizziness, syncope, speech difficulty, weakness, light-headedness, numbness and headaches.    Immunization History  Administered Date(s) Administered   PFIZER(Purple Top)SARS-COV-2 Vaccination 12/26/2019, 01/16/2020   PPD Test 04/25/2016, 05/02/2016   Pertinent  Health Maintenance Due  Topic Date Due   PAP SMEAR-Modifier  10/23/2022 (Originally 12/25/2003)   INFLUENZA VACCINE  Discontinued      06/13/2019   11:16 AM 08/30/2021    9:23 AM 09/27/2021   10:00 AM 10/07/2021  9:13 AM 10/09/2022    9:41 AM  Fall Risk  Falls in the past year? 0  0 0 0  Was there an injury with Fall?   0 0 0  Fall Risk Category Calculator   0 0 0  Fall Risk Category (Retired)   Low Low   (RETIRED) Patient Fall Risk Level Low fall risk Low fall risk Low fall risk Low fall risk   Patient at Risk for Falls Due to   No  Fall Risks No Fall Risks No Fall Risks  Fall risk Follow up   Falls evaluation completed Falls evaluation completed Falls evaluation completed   Functional Status Survey:    Vitals:   10/23/22 0904 10/23/22 0907  BP: (!) 200/102 (!) 180/110  Pulse: 86   Temp: (!) 97.3 F (36.3 C)   TempSrc: Temporal   SpO2: 97%   Weight: 161 lb (73 kg)   Height: 5' 7.75" (1.721 m)    Body mass index is 24.66 kg/m. Physical Exam Vitals reviewed. 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  Constitutional:      General: She is not in acute distress.    Appearance: Normal appearance. She is normal weight. She is not ill-appearing or diaphoretic.  HENT:     Head: Normocephalic.  Eyes:     General: No scleral icterus.       Right eye: No discharge.        Left eye: No discharge.     Conjunctiva/sclera: Conjunctivae normal.     Pupils: Pupils are equal, round, and reactive to light.  Neck:     Vascular: No carotid bruit.  Cardiovascular:     Rate and Rhythm: Normal rate and regular rhythm.     Pulses: Normal pulses.     Heart sounds: Normal heart sounds. No murmur heard.    No friction rub. No gallop.  Pulmonary:     Effort: Pulmonary effort is normal. No respiratory distress.     Breath sounds: Normal breath sounds. No wheezing, rhonchi or rales.  Chest:     Chest wall: No tenderness.  Breasts:    Right: Normal.     Left: Normal.  Abdominal:     General: Bowel sounds are normal. There is no distension.     Palpations: Abdomen is soft. There is no mass.     Tenderness: There is no abdominal tenderness. There is no right CVA tenderness, left CVA tenderness, guarding or rebound.     Hernia: There is no hernia in the left inguinal area or right inguinal area.  Genitourinary:    Exam position: Lithotomy position.     Pubic Area: No rash or pubic lice.      Labia:        Right: No rash, tenderness, lesion or injury.        Left: No rash, tenderness, lesion or injury.       Urethra: No prolapse, urethral pain, urethral swelling or urethral lesion.     Vagina: Normal.     Cervix: Normal.     Uterus: Normal.      Adnexa: Right adnexa normal and left adnexa normal.  Musculoskeletal:        General: No swelling or tenderness. Normal range of motion.     Cervical back: Normal range of motion. No rigidity or tenderness.     Right lower leg: No edema.     Left lower leg: No edema.  Lymphadenopathy:     Cervical:  No cervical adenopathy.     Upper Body:     Right upper body: No supraclavicular, axillary or pectoral adenopathy.     Left upper body: No supraclavicular, axillary or pectoral adenopathy.     Lower Body: No right inguinal adenopathy. No left inguinal adenopathy.  Skin:    General: Skin is warm and dry.     Coloration: Skin is not pale.     Findings: No bruising, erythema, lesion or rash.  Neurological:     Mental Status: She is alert and oriented to person, place, and time.     Cranial Nerves: No cranial nerve deficit.     Sensory: No sensory deficit.     Motor: No weakness.     Coordination: Coordination normal.     Gait: Gait normal.  Psychiatric:        Mood and Affect: Mood normal.        Speech: Speech normal.        Behavior: Behavior normal.        Thought Content: Thought content normal.        Judgment: Judgment normal.     Labs reviewed: No results for input(s): "NA", "K", "CL", "CO2", "GLUCOSE", "BUN", "CREATININE", "CALCIUM", "MG", "PHOS" in the last 8760 hours. No results for input(s): "AST", "ALT", "ALKPHOS", "BILITOT", "PROT", "ALBUMIN" in the last 8760 hours. No results for input(s): "WBC", "NEUTROABS", "HGB", "HCT", "MCV", "PLT" in the last 8760 hours. Lab Results  Component Value Date   TSH 1.20 10/07/2021   No results found for: "HGBA1C" Lab Results  Component Value Date   CHOL 158 10/07/2021   HDL 48 (L) 10/07/2021   LDLCALC 94 10/07/2021   TRIG 75 10/07/2021   CHOLHDL 3.3 10/07/2021    Significant  Diagnostic Results in last 30 days:  No results found.  Assessment/Plan 1. Cervical cancer screening Normal pelvic exam.tolerated procedure well.Chaperone present through out the exam. - PAP, Image Guided [[facility]]  2. Primary hypertension B/p pressure elevated. Asymptomatic  - start on amlodipine 5 mg tablet daily then follow up in 2 week for evaluation will increase amlodipine to 10 mg tablet if B/p still elevated. -  Advised to check Blood pressure at home and record on log provided and notify provider if B/p > 140/90  - Lifestyle modification advised.  Family/ staff Communication: Reviewed plan of care with patient verbalized understanding.  Labs/tests ordered:  PAP, Image Guided [[facility]]   Next Appointment: Return in about 2 weeks (around 11/06/2022) for blood pressure .   Sandrea Hughs, NP

## 2022-10-27 LAB — PAP IG (IMAGE GUIDED)

## 2022-11-06 ENCOUNTER — Ambulatory Visit: Payer: No Typology Code available for payment source | Admitting: Family

## 2022-11-11 ENCOUNTER — Ambulatory Visit (INDEPENDENT_AMBULATORY_CARE_PROVIDER_SITE_OTHER): Payer: No Typology Code available for payment source | Admitting: Family

## 2022-11-11 ENCOUNTER — Encounter: Payer: Self-pay | Admitting: Family

## 2022-11-11 DIAGNOSIS — I10 Essential (primary) hypertension: Secondary | ICD-10-CM | POA: Diagnosis not present

## 2022-11-11 MED ORDER — LOSARTAN POTASSIUM 50 MG PO TABS: 50.0000 mg | ORAL_TABLET | Freq: Every day | ORAL | 1 refills | Status: DC

## 2022-11-11 MED ORDER — AMLODIPINE BESYLATE 10 MG PO TABS: 10.0000 mg | ORAL_TABLET | Freq: Every day | ORAL | 1 refills | Status: DC

## 2022-11-11 MED ORDER — CYCLOBENZAPRINE HCL 10 MG PO TABS: 10.0000 mg | ORAL_TABLET | Freq: Three times a day (TID) | ORAL | 1 refills | Status: DC | PRN

## 2022-11-11 NOTE — Patient Instructions (Signed)
-   check Blood pressure at home and record on log provided and notify provider if B/p > 140/90 Please send a another dose/package as soon as possible. Send Record on Mychart once a week for provider to evaluate.

## 2022-11-11 NOTE — Progress Notes (Signed)
Provider: Marlowe Sax FNP-C  Abriel Hattery, Nelda Bucks, NP  Patient Care Team: Abdulahad Mederos, Nelda Bucks, NP as PCP - General (Family Medicine)  Extended Emergency Contact Information Primary Emergency Contact: Escalera,Shirley Address: [address]          [place] [address] Johnnette Litter of [place] Phone: [phone] Relation: Mother  Code Status:  Full Code  Goals of care: Advanced Directive information    10/23/2022    9:04 AM  Advanced Directives  Does Patient Have a Medical Advance Directive? No  Would patient like information on creating a medical advance directive? No - Patient declined     Chief Complaint  Patient presents with   Medical Management of Chronic Issues    Patient presents today for a 2 weeks HTN follow-up.   Quality Metric Gaps    Pap smear, TDAP, COVID#3    HPI:  Pt is a 42 y.o. female seen today for an acute visit for 2 weeks follow up for high blood pressure.Her Bp was 200/102 recheck was 180/100.she was started on Amlodipine 5 mg tablet daily for 2 weeks then increase to 10 mg tablet daily.she ws continued on Losartan 25 mg tablet daily.Dietary modification and exercise and advised to increase water intake. Has been taking B/p medication as prescribed. She brought in her home B/p log readings 140's/100's - 190's /130's HR upper 60's - 80's.reports headache when blood pressure are high. Took blood pressure readings prior to taking her medication.yesterday B/p was 113/79 and HR 89 states felt faint,hot ,sweating and felt like she was spinning.No symptoms today.  States has increased water intake to 3 bottles per day trying to increase.  She continue to smoke cigarettes.  She denies any dizziness,vision changes,fatigue,chest tightness,palpitation,chest pain or shortness of breath.       Past Medical History:  Diagnosis Date   Hypertension    Trichomonas infection 2023   No past surgical history on file.  No Known Allergies  Outpatient Encounter  Medications as of 11/11/2022  Medication Sig   cetirizine (ZYRTEC) 10 MG tablet Take 10 mg by mouth daily.   fluticasone (FLONASE) 50 MCG/ACT nasal spray Place 2 sprays into both nostrils daily.   hydrochlorothiazide (HYDRODIURIL) 12.5 MG tablet Take 2 tablets (25 mg total) by mouth daily.   ibuprofen (ADVIL) 200 MG tablet Take 400 mg by mouth as needed.   [DISCONTINUED] amLODipine (NORVASC) 5 MG tablet Take 1 tablet (5 mg total) by mouth daily.   [DISCONTINUED] cyclobenzaprine (FLEXERIL) 10 MG tablet Take 10 mg by mouth 3 (three) times daily as needed for muscle spasms.   [DISCONTINUED] losartan (COZAAR) 25 MG tablet Take 1 tablet (25 mg total) by mouth daily.   amLODipine (NORVASC) 10 MG tablet Take 1 tablet (10 mg total) by mouth daily.   cyclobenzaprine (FLEXERIL) 10 MG tablet Take 1 tablet (10 mg total) by mouth 3 (three) times daily as needed for muscle spasms.   losartan (COZAAR) 50 MG tablet Take 1 tablet (50 mg total) by mouth daily.   No facility-administered encounter medications on file as of 11/11/2022.    Review of Systems  Constitutional:  Negative for appetite change, chills, fatigue, fever and unexpected weight change.  HENT:  Negative for congestion, dental problem, ear discharge, ear pain, facial swelling, hearing loss, nosebleeds, postnasal drip, rhinorrhea, sinus pressure, sinus pain, sneezing, sore throat, tinnitus and trouble swallowing.   Eyes:  Negative for pain, discharge, redness, itching and visual disturbance.  Respiratory:  Negative for cough, chest tightness,  shortness of breath and wheezing.   Cardiovascular:  Negative for chest pain, palpitations and leg swelling.  Gastrointestinal:  Negative for abdominal distention, abdominal pain, blood in stool, constipation, diarrhea, nausea and vomiting.  Endocrine: Negative for cold intolerance, heat intolerance, polydipsia, polyphagia and polyuria.  Genitourinary:  Negative for difficulty urinating, dysuria, flank pain,  frequency and urgency.  Musculoskeletal:  Negative for arthralgias, back pain, gait problem, joint swelling, myalgias, neck pain and neck stiffness.  Skin:  Negative for color change, pallor, rash and wound.  Neurological:  Negative for dizziness, syncope, speech difficulty, weakness, light-headedness, numbness and headaches.  Hematological:  Does not bruise/bleed easily.  Psychiatric/Behavioral:  Negative for agitation, behavioral problems, confusion, hallucinations, self-injury, sleep disturbance and suicidal ideas. The patient is not nervous/anxious.     Immunization History  Administered Date(s) Administered   PFIZER(Purple Top)SARS-COV-2 Vaccination 12/26/2019, 01/16/2020   PPD Test 04/25/2016, 05/02/2016   Pertinent  Health Maintenance Due  Topic Date Due   PAP SMEAR-Modifier  Never done   INFLUENZA VACCINE  Discontinued      06/13/2019   11:16 AM 08/30/2021    9:23 AM 09/27/2021   10:00 AM 10/07/2021    9:13 AM 10/09/2022    9:41 AM  [place] in the past year? 0  0 0 0  Was there an injury with Fall?   0 0 0  Fall Risk Category Calculator   0 0 0  Fall Risk Category (Retired)   Low Low   (RETIRED) Patient Fall Risk Level Low fall risk Low fall risk Low fall risk Low fall risk   Patient at Risk for Falls Due to   No Fall Risks No Fall Risks No Fall Risks  Fall risk Follow up   Falls evaluation completed Falls evaluation completed Falls evaluation completed   Functional Status Survey:    Vitals:   11/11/22 1521  BP: (!) 140/92  Pulse: 83  Temp: (!) 96.7 F (35.9 C)  SpO2: 97%  Weight: 155 lb 9.6 oz (70.6 kg)  Height: 5' 7.75" (1.721 m)   Body mass index is 23.83 kg/m. Physical Exam Vitals reviewed.  Constitutional:      General: She is not in acute distress.    Appearance: Normal appearance. She is normal weight. She is not ill-appearing or diaphoretic.  HENT:     Head: Normocephalic.     Right Ear: Tympanic membrane, ear canal and external ear normal.  There is no impacted cerumen.     Left Ear: Tympanic membrane, ear canal and external ear normal. There is no impacted cerumen.     Nose: Nose normal. No congestion or rhinorrhea.     Mouth/Throat:     Mouth: Mucous membranes are moist.     Pharynx: Oropharynx is clear. No oropharyngeal exudate or posterior oropharyngeal erythema.  Eyes:     General: No scleral icterus.       Right eye: No discharge.        Left eye: No discharge.     Extraocular Movements: Extraocular movements intact.     Conjunctiva/sclera: Conjunctivae normal.     Pupils: Pupils are equal, round, and reactive to light.  Neck:     Vascular: No carotid bruit.  Cardiovascular:     Rate and Rhythm: Normal rate and regular rhythm.     Pulses: Normal pulses.     Heart sounds: Normal heart sounds. No murmur heard.    No friction rub. No gallop.  Pulmonary:  Effort: Pulmonary effort is normal. No respiratory distress.     Breath sounds: Normal breath sounds. No wheezing, rhonchi or rales.  Chest:     Chest wall: No tenderness.  Abdominal:     General: Bowel sounds are normal. There is no distension.     Palpations: Abdomen is soft. There is no mass.     Tenderness: There is no abdominal tenderness. There is no right CVA tenderness, left CVA tenderness, guarding or rebound.  Musculoskeletal:        General: No swelling or tenderness. Normal range of motion.     Cervical back: Normal range of motion. No rigidity or tenderness.     Right lower leg: No edema.     Left lower leg: No edema.  Lymphadenopathy:     Cervical: No cervical adenopathy.  Skin:    General: Skin is warm and dry.     Coloration: Skin is not pale.     Findings: No bruising, erythema, lesion or rash.  Neurological:     Mental Status: She is alert and oriented to person, place, and time.     Cranial Nerves: No cranial nerve deficit.     Sensory: No sensory deficit.     Motor: No weakness.     Coordination: Coordination normal.     Gait: Gait  normal.  Psychiatric:        Mood and Affect: Mood normal.        Speech: Speech normal.        Behavior: Behavior normal.        Thought Content: Thought content normal.        Judgment: Judgment normal.     Labs reviewed: No results for input(s): "NA", "K", "CL", "CO2", "GLUCOSE", "BUN", "CREATININE", "CALCIUM", "MG", "PHOS" in the last 8760 hours. No results for input(s): "AST", "ALT", "ALKPHOS", "BILITOT", "PROT", "ALBUMIN" in the last 8760 hours. No results for input(s): "WBC", "NEUTROABS", "HGB", "HCT", "MCV", "PLT" in the last 8760 hours. Lab Results  Component Value Date   TSH 1.20 10/07/2021   No results found for: "HGBA1C" Lab Results  Component Value Date   CHOL 158 10/07/2021   HDL 48 (L) 10/07/2021   LDLCALC 94 10/07/2021   TRIG 75 10/07/2021   CHOLHDL 3.3 10/07/2021    Significant Diagnostic Results in last 30 days:  No results found.  Assessment/Plan Primary hypertension B/p still elevated. - will increase amlodipine from 5 mg tablet to 10 mg tablet daily.also increase Losartan from 25 mg tablet to 50 mg tablet daily. - Advised to check Blood pressure at home and record on log provided and notify provider if B/p > 140/90 she will send B/p readings via Mychart in one week. - will refer to Hypertension clinic if B/p still elevated.  - amLODipine (NORVASC) 10 MG tablet; Take 1 tablet (10 mg total) by mouth daily.  Dispense: 90 tablet; Refill: 1 - losartan (COZAAR) 50 MG tablet; Take 1 tablet (50 mg total) by mouth daily.  Dispense: 90 tablet; Refill: 1 Family/ staff Communication: Reviewed plan of care with patient verbalized understanding   Labs/tests ordered: None   Next Appointment: Return if symptoms worsen or fail to improve.   Sandrea Hughs, NP

## 2023-03-18 ENCOUNTER — Other Ambulatory Visit: Payer: Self-pay | Admitting: Family

## 2023-03-18 DIAGNOSIS — I10 Essential (primary) hypertension: Secondary | ICD-10-CM

## 2023-03-30 ENCOUNTER — Other Ambulatory Visit: Payer: Self-pay | Admitting: Family

## 2023-03-30 NOTE — Progress Notes (Signed)
Letter written as requested for excuse to keep two dogs for anxiety therapy.

## 2023-04-09 ENCOUNTER — Ambulatory Visit: Payer: No Typology Code available for payment source | Admitting: Family

## 2023-04-14 ENCOUNTER — Encounter: Payer: No Typology Code available for payment source | Admitting: Family

## 2023-04-14 NOTE — Progress Notes (Signed)
  This encounter was created in error - please disregard. No show 

## 2023-10-05 ENCOUNTER — Encounter: Payer: No Typology Code available for payment source | Admitting: Family

## 2023-10-12 ENCOUNTER — Encounter: Payer: No Typology Code available for payment source | Admitting: Family

## 2024-07-05 ENCOUNTER — Ambulatory Visit: Admitting: Family

## 2024-07-27 ENCOUNTER — Ambulatory Visit (INDEPENDENT_AMBULATORY_CARE_PROVIDER_SITE_OTHER): Payer: Self-pay | Admitting: Family

## 2024-07-27 ENCOUNTER — Encounter: Payer: Self-pay | Admitting: Family

## 2024-07-27 VITALS — BP 176/110 | HR 90 | Temp 98.1°F | Resp 20 | Ht 67.5 in | Wt 166.4 lb

## 2024-07-27 DIAGNOSIS — F411 Generalized anxiety disorder: Secondary | ICD-10-CM | POA: Diagnosis not present

## 2024-07-27 DIAGNOSIS — F17209 Nicotine dependence, unspecified, with unspecified nicotine-induced disorders: Secondary | ICD-10-CM | POA: Diagnosis not present

## 2024-07-27 DIAGNOSIS — I10 Essential (primary) hypertension: Secondary | ICD-10-CM | POA: Diagnosis not present

## 2024-07-27 DIAGNOSIS — Z1231 Encounter for screening mammogram for malignant neoplasm of breast: Secondary | ICD-10-CM | POA: Diagnosis not present

## 2024-07-27 DIAGNOSIS — G8929 Other chronic pain: Secondary | ICD-10-CM

## 2024-07-27 DIAGNOSIS — M545 Low back pain, unspecified: Secondary | ICD-10-CM

## 2024-07-27 DIAGNOSIS — J302 Other seasonal allergic rhinitis: Secondary | ICD-10-CM

## 2024-07-27 MED ORDER — HYDROCHLOROTHIAZIDE 12.5 MG PO TABS: 25.0000 mg | ORAL_TABLET | Freq: Every day | ORAL | 1 refills | Status: AC

## 2024-07-27 MED ORDER — AMLODIPINE BESYLATE 10 MG PO TABS: 10.0000 mg | ORAL_TABLET | Freq: Every day | ORAL | 1 refills | Status: AC

## 2024-07-27 MED ORDER — LOSARTAN POTASSIUM 50 MG PO TABS: 50.0000 mg | ORAL_TABLET | Freq: Every day | ORAL | 1 refills | Status: AC

## 2024-07-27 MED ORDER — BUPROPION HCL ER (SR) 150 MG PO TB12: 150.0000 mg | ORAL_TABLET | Freq: Every day | ORAL | 3 refills | Status: DC

## 2024-07-27 NOTE — Patient Instructions (Signed)
 1.Report to local pharmacy to receive Tdap & HPV Vaccines.

## 2024-08-07 NOTE — Progress Notes (Signed)
 Provider: Roxan Plough FNP-C   [name], Roxan BROCKS, NP  Patient Care Team: Aarion Metzgar, Roxan BROCKS, NP as PCP - General (Family Medicine)  Extended Emergency Contact Information Primary Emergency Contact: Iles,Shirley Address: [address] [address] [place] Home Phone: [phone] Relation: Mother  Code Status:  Full Code  Goals of care: Advanced Directive information    07/27/2024    2:16 PM  Advanced Directives  Does Patient Have a Medical Advance Directive? No  Would patient like information on creating a medical advance directive? No - Patient declined     Chief Complaint  Patient presents with   Annual Exam    Physical.    Discussed the use of AI scribe software for clinical note transcription with the patient, who gave verbal consent to proceed.  History of Present Illness   Angel Chavez Chavez is a 43 year old female with hypertension who presents for an annual physical exam.  She has been experiencing consistently high blood pressure readings, with a recent measurement of 176/110 mmHg. She has not been taking her prescribed antihypertensive medications, including amlodipine , hydrochlorothiazide , and losartan , due to side effects such as hair loss. She is concerned about the risk of complications like heart attack or stroke, especially after a friend's recent death from a heart attack.  She continues to smoke and has attempted to cut down but finds it challenging. She has not used any smoking cessation aids in the past but wants to quit smoking and is open to trying new methods to assist with cessation.  She experiences frequent anxiety, which she manages with the help of her dog. Working from home allows her to take breaks and manage her anxiety more effectively. She requests a new emotional support animal (ESA) letter due to an upcoming move.  Her current medications include cetirizine for allergies, Flexeril  10 mg as needed for muscle  relaxation, Flonase for nasal symptoms, and over-the-counter ibuprofen  400 mg as needed for pain. She has not been taking her prescribed blood pressure medications.  She works from home, which helps manage her anxiety. She plans to increase physical activity by walking during breaks and using an app for exercises related to her back pain.  Past Medical History:  Diagnosis Date   Hypertension    Trichomonas infection 2023   History reviewed. No pertinent surgical history.  No Known Allergies  Allergies as of 07/27/2024   No Known Allergies      Medication List        Accurate as of July 27, 2024 11:59 PM. If you have any questions, ask your nurse or doctor.          amLODipine  10 MG tablet Commonly known as: NORVASC  Take 1 tablet (10 mg total) by mouth daily.   buPROPion  150 MG 12 hr tablet Commonly known as: Wellbutrin  SR Take 1 tablet (150 mg total) by mouth daily. Started by: Amandalee Lacap C Waseem Suess   cetirizine 10 MG tablet Commonly known as: ZYRTEC Take 10 mg by mouth daily.   cyclobenzaprine  10 MG tablet Commonly known as: FLEXERIL  Take 1 tablet (10 mg total) by mouth 3 (three) times daily as needed for muscle spasms.   fluticasone 50 MCG/ACT nasal spray Commonly known as: FLONASE Place 2 sprays into both nostrils daily.   hydrochlorothiazide  12.5 MG tablet Commonly known as: HYDRODIURIL  Take 2 tablets (25 mg total) by mouth daily.   ibuprofen  200 MG tablet Commonly  known as: ADVIL  Take 400 mg by mouth as needed.   losartan  50 MG tablet Commonly known as: COZAAR  Take 1 tablet (50 mg total) by mouth daily.        Review of Systems  Constitutional:  Negative for appetite change, chills, fatigue, fever and unexpected weight change.  HENT:  Negative for congestion, dental problem, ear discharge, ear pain, facial swelling, hearing loss, nosebleeds, postnasal drip, rhinorrhea, sinus pressure, sinus pain, sneezing, sore throat, tinnitus and trouble  swallowing.   Eyes:  Negative for pain, discharge, redness, itching and visual disturbance.  Respiratory:  Negative for cough, chest tightness, shortness of breath and wheezing.   Cardiovascular:  Negative for chest pain, palpitations and leg swelling.  Gastrointestinal:  Negative for abdominal distention, abdominal pain, blood in stool, constipation, diarrhea, nausea and vomiting.  Endocrine: Negative for cold intolerance, heat intolerance, polydipsia, polyphagia and polyuria.  Genitourinary:  Negative for difficulty urinating, dysuria, flank pain, frequency and urgency.  Musculoskeletal:  Negative for arthralgias, back pain, gait problem, joint swelling, myalgias, neck pain and neck stiffness.  Skin:  Negative for color change, pallor, rash and wound.  Neurological:  Negative for dizziness, syncope, speech difficulty, weakness, light-headedness, numbness and headaches.  Hematological:  Does not bruise/bleed easily.  Psychiatric/Behavioral:  Negative for agitation, behavioral problems, confusion, hallucinations, self-injury, sleep disturbance and suicidal ideas. The patient is nervous/anxious.     Immunization History  Administered Date(s) Administered   PFIZER(Purple Top)SARS-COV-2 Vaccination 12/26/2019, 01/16/2020   PPD Test 04/25/2016, 05/02/2016   Pertinent  Health Maintenance Due  Topic Date Due   Mammogram  12/25/2022   Influenza Vaccine  Discontinued      08/30/2021    9:23 AM 09/27/2021   10:00 AM 10/07/2021    9:13 AM 10/09/2022    9:41 AM 07/27/2024    2:15 PM  Fall Risk  Falls in the past year?  0 0 0 0  Was there an injury with Fall?  0 0 0 0  Fall Risk Category Calculator  0 0 0 0  Fall Risk Category (Retired)  Low  Low     (RETIRED) Patient Fall Risk Level Low fall risk  Low fall risk  Low fall risk     Patient at Risk for Falls Due to  No Fall Risks No Fall Risks No Fall Risks No Fall Risks  Fall risk Follow up  Falls evaluation completed  Falls evaluation  completed  Falls evaluation completed  Falls evaluation completed     Data saved with a previous flowsheet row definition   Functional Status Survey:    Vitals:   07/27/24 1422  BP: (!) 176/110  Pulse: 90  Resp: 20  Temp: 98.1 F (36.7 C)  SpO2: 99%  Weight: 166 lb 6.4 oz (75.5 kg)  Height: 5' 7.5 (1.715 m)   Body mass index is 25.68 kg/m. Physical Exam VITALS: P- 90, BP- 176/110 GENERAL: Alert, cooperative, well developed, no acute distress HEENT: Normocephalic, normal oropharynx, moist mucous membranes, eyes normal, no sinus tenderness NECK: No lymphadenopathy, thyroid normal CHEST: Clear to auscultation bilaterally, no wheezes, rhonchi, or crackles CARDIOVASCULAR: Normal heart rate and rhythm, S1 and S2 normal without murmurs ABDOMEN: Soft, non-tender, non-distended, without organomegaly, normal bowel sounds EXTREMITIES: No cyanosis or edema, sensation intact MUSCULOSKELETAL: Hips and knees normal range of motion NEUROLOGICAL: Cranial nerves grossly intact, moves all extremities without gross motor or sensory deficit  SKIN: No rash,no lesion or erythema   PSYCHIATRY/BEHAVIORAL: Mood stable   Labs reviewed: No  results for input(s): NA, K, CL, CO2, GLUCOSE, BUN, CREATININE, CALCIUM, MG, PHOS in the last 8760 hours. No results for input(s): AST, ALT, ALKPHOS, BILITOT, PROT, ALBUMIN in the last 8760 hours. No results for input(s): WBC, NEUTROABS, HGB, HCT, MCV, PLT in the last 8760 hours. Lab Results  Component Value Date   TSH 1.20 10/07/2021   No results found for: HGBA1C Lab Results  Component Value Date   CHOL 158 10/07/2021   HDL 48 (L) 10/07/2021   LDLCALC 94 10/07/2021   TRIG 75 10/07/2021   CHOLHDL 3.3 10/07/2021    Significant Diagnostic Results in last 30 days:  No results found.  Assessment/Plan  Adult Wellness Visit Annual physical examination conducted. Blood pressure elevated at 176/110 mmHg. Heart  rate elevated at 90 bpm. No significant changes in vision or dental health. No tenderness or swelling in legs. No numbness or tingling. Bowel movements regular. No constipation or diarrhea. No sinus tenderness or swollen lymph nodes. No changes in vision. No dental visit this year. No flu shot received. Tetanus vaccine pending. Hepatitis B and COVID vaccines available at pharmacy. - Ordered fasting blood work in two weeks - Scheduled follow-up appointment in two weeks to monitor blood pressure and Wellbutrin  efficacy - Provided ESA letter for emotional support animal - Encouraged regular exercise and healthy diet - Advised to drink plenty of water  Essential hypertension Blood pressure significantly elevated at 176/110 mmHg. Heart rate elevated at 90 bpm. Risk of stroke and heart failure discussed. Importance of medication adherence emphasized. Plan to wean off medication once blood pressure is controlled. Discussed potential for heart enlargement and congestive heart failure if uncontrolled. - Restarted amlodipine , hydrochlorothiazide , and losartan  - Monitor blood pressure at home - Follow up in two weeks to assess blood pressure control  Nicotine  dependence Continues to smoke despite attempts to cut down. Discussed smoking cessation options including nicotine  gum, nicotine  patch, and Wellbutrin . Chantix discontinued due to adverse effects. She expressed desire to quit smoking. - Initiated Wellbutrin  once daily, will increase to twice daily if tolerated - Will follow up in two weeks to assess progress with smoking cessation  Generalized anxiety disorder Anxiety flares frequently. Emotional support from dog noted. Working from home provides some relief. No current medication for anxiety. - Provided ESA letter for emotional support animal  Allergic rhinitis Continues to take cetirizine and Flonase for allergy management. - Continue cetirizine and Flonase as needed  Chronic back pain Uses  Flexeril  as needed for muscle relaxation. Engaged in physical activity and using Hand Health app for exercises. - Continue Flexeril  as needed - Encouraged use of Hand Health app for exercises  Screening mammogram for breast cancer Mammogram referral needed due to equipment issues at previous facility. - Sent referral for mammogram at new facility in [place]  Immunization needs Tetanus vaccine pending. Flu shot not received. Hepatitis B and COVID vaccines available at pharmacy. - Advised to receive tetanus vaccine - Advised to receive flu shot - Advised to receive hepatitis B and COVID vaccines at pharmacy  Family/ staff Communication: Reviewed plan of care with patient verbalized understanding   Labs/tests ordered:  - CBC with Differential/Platelet - CMP with eGFR([facility]) - TSH - Lipid panel - Mammogram    Next Appointment : Return in about 2 weeks (around 08/10/2024) for Blood pressure and smoking  and Fasting labs .   Spent 30 minutes of Face to face and non-face to face with patient  >50% time spent counseling; reviewing medical record; tests; labs;  documentation and developing future plan of care.  Roxan JAYSON Plough, NP

## 2024-08-09 ENCOUNTER — Ambulatory Visit: Payer: Self-pay | Admitting: Family

## 2024-08-23 ENCOUNTER — Ambulatory Visit: Payer: Self-pay | Admitting: Family

## 2024-08-23 ENCOUNTER — Encounter: Payer: Self-pay | Admitting: Family

## 2024-08-23 VITALS — BP 139/76 | HR 88 | Temp 95.1°F | Resp 18 | Ht 67.5 in | Wt 164.8 lb

## 2024-08-23 DIAGNOSIS — I10 Essential (primary) hypertension: Secondary | ICD-10-CM

## 2024-08-23 DIAGNOSIS — F411 Generalized anxiety disorder: Secondary | ICD-10-CM

## 2024-08-23 DIAGNOSIS — F17209 Nicotine dependence, unspecified, with unspecified nicotine-induced disorders: Secondary | ICD-10-CM

## 2024-08-23 MED ORDER — BUPROPION HCL ER (SR) 150 MG PO TB12: 150.0000 mg | ORAL_TABLET | Freq: Two times a day (BID) | ORAL | 3 refills | Status: AC

## 2024-08-23 MED ORDER — ASPIRIN 81 MG PO TBEC: 81.0000 mg | DELAYED_RELEASE_TABLET | Freq: Every day | ORAL | Status: AC

## 2024-08-23 MED ORDER — CYCLOBENZAPRINE HCL 10 MG PO TABS: 10.0000 mg | ORAL_TABLET | Freq: Three times a day (TID) | ORAL | 1 refills | Status: AC | PRN

## 2024-08-23 NOTE — Progress Notes (Signed)
 Provider: Roxan Plough FNP-C   Severiano Utsey, Roxan BROCKS, NP  Patient Care Team: Jaylon Grode, Roxan BROCKS, NP as PCP - General (Family Medicine)  Extended Emergency Contact Information Primary Emergency Contact: Guardino,Shirley Address: [address] [place] [address] [place] Home Phone: [phone] Relation: Mother  Code Status:  Full Code  Goals of care: Advanced Directive information    07/27/2024    2:16 PM  Advanced Directives  Does Patient Have a Medical Advance Directive? No  Would patient like information on creating a medical advance directive? No - Patient declined     Chief Complaint  Patient presents with   Medical Management of Chronic Issues     2 weeks      Discussed the use of AI scribe software for clinical note transcription with the patient, who gave verbal consent to proceed.  History of Present Illness   Angel Chavez is a 43 year old female with hypertension who presents for a follow-up visit to recheck blood pressure and assess response to Wellbutrin  for smoking cessation.  She is currently taking Wellbutrin  150 mg once daily to aid in smoking cessation. She notices a change in her desire to smoke, stating 'I see a change in having a taste for it.'  Her blood pressure has been gradually decreasing. Initially, her readings were in the 150s, then dropped to the 140s, and today it was in the 130s. She is on hydrochlorothiazide  25 mg daily, losartan  50 mg daily, and amlodipine  10 mg daily. She mentions feeling better and 'breathing a little easier.'  She has a history of smoking, which is relevant to her cardiovascular health. She has been attending [facility] for exercise, which she reports is going well.  She notes feeling cold, which she attributes to the weather.    Past Medical History:  Diagnosis Date   Hypertension    Trichomonas infection 2023   History reviewed. No pertinent surgical history.  No Known Allergies  Allergies  as of 08/23/2024   No Known Allergies      Medication List        Accurate as of August 23, 2024 11:34 AM. If you have any questions, ask your nurse or doctor.          amLODipine  10 MG tablet Commonly known as: NORVASC  Take 1 tablet (10 mg total) by mouth daily.   aspirin  EC 81 MG tablet Take 1 tablet (81 mg total) by mouth daily. Swallow whole. Started by: Anastashia Westerfeld C Melayna Robarts   buPROPion  150 MG 12 hr tablet Commonly known as: Wellbutrin  SR Take 1 tablet (150 mg total) by mouth 2 (two) times daily. What changed: when to take this Changed by: Hana Trippett C Hermelinda Diegel   cetirizine 10 MG tablet Commonly known as: ZYRTEC Take 10 mg by mouth daily.   cyclobenzaprine  10 MG tablet Commonly known as: FLEXERIL  Take 1 tablet (10 mg total) by mouth 3 (three) times daily as needed for muscle spasms.   fluticasone 50 MCG/ACT nasal spray Commonly known as: FLONASE Place 2 sprays into both nostrils daily.   hydrochlorothiazide  12.5 MG tablet Commonly known as: HYDRODIURIL  Take 2 tablets (25 mg total) by mouth daily.   ibuprofen  200 MG tablet Commonly known as: ADVIL  Take 400 mg by mouth as needed.   losartan  50 MG tablet Commonly known as: COZAAR  Take 1 tablet (50 mg total) by mouth daily.        Review of Systems  Constitutional:  Negative for appetite change, chills, fatigue, fever and unexpected weight change.  HENT:  Negative for congestion, dental problem, ear discharge, ear pain, hearing loss, nosebleeds, postnasal drip, rhinorrhea, sinus pressure, sinus pain, sneezing, sore throat, tinnitus and trouble swallowing.   Eyes:  Negative for pain, discharge, redness, itching and visual disturbance.  Respiratory:  Negative for cough, chest tightness, shortness of breath and wheezing.   Cardiovascular:  Negative for chest pain, palpitations and leg swelling.  Gastrointestinal:  Negative for abdominal distention, abdominal pain, blood in stool, constipation, diarrhea, nausea and  vomiting.  Endocrine: Negative for cold intolerance, heat intolerance, polydipsia, polyphagia and polyuria.  Genitourinary:  Negative for difficulty urinating, dysuria, flank pain, frequency and urgency.  Musculoskeletal:  Negative for arthralgias, back pain, gait problem, joint swelling, myalgias, neck pain and neck stiffness.  Skin:  Negative for color change, pallor, rash and wound.  Neurological:  Negative for dizziness, syncope, speech difficulty, weakness, light-headedness, numbness and headaches.    Immunization History  Administered Date(s) Administered   PFIZER(Purple Top)SARS-COV-2 Vaccination 12/26/2019, 01/16/2020   PPD Test 04/25/2016, 05/02/2016   Pertinent  Health Maintenance Due  Topic Date Due   Mammogram  12/25/2022   Influenza Vaccine  Discontinued      08/30/2021    9:23 AM 09/27/2021   10:00 AM 10/07/2021    9:13 AM 10/09/2022    9:41 AM 07/27/2024    2:15 PM  Fall Risk  Falls in the past year?  0 0 0 0  Was there an injury with Fall?  0  0  0  0   Fall Risk Category Calculator  0 0 0 0  Fall Risk Category (Retired)  Low  Low     (RETIRED) Patient Fall Risk Level Low fall risk  Low fall risk  Low fall risk     Patient at Risk for Falls Due to  No Fall Risks No Fall Risks No Fall Risks No Fall Risks  Fall risk Follow up  Falls evaluation completed  Falls evaluation completed  Falls evaluation completed  Falls evaluation completed     Data saved with a previous flowsheet row definition   Functional Status Survey:    Vitals:   08/23/24 1034  BP: 139/76  Pulse: 88  Resp: 18  Temp: (!) 95.1 F (35.1 C)  SpO2: 95%  Weight: 164 lb 12.8 oz (74.8 kg)  Height: 5' 7.5 (1.715 m)   Body mass index is 25.43 kg/m. Physical Exam VITALS: T- 95.1, P- 88, BP- 139/76 MEASUREMENTS: Weight- 164.8. GENERAL: Alert, cooperative, well developed, no acute distress HEENT: Normocephalic, normal oropharynx, moist mucous membranes CHEST: Clear to auscultation bilaterally,  no wheezes, rhonchi, or crackles CARDIOVASCULAR: Normal heart rate and rhythm, S1 and S2 normal without murmurs ABDOMEN: Soft, non-tender, non-distended, without organomegaly, normal bowel sounds EXTREMITIES: No cyanosis or edema NEUROLOGICAL: Cranial nerves grossly intact, moves all extremities without gross motor or sensory deficit  SKIN: No rash,no lesion or erythema   PSYCHIATRY/BEHAVIORAL: Mood stable    Labs reviewed: No results for input(s): NA, K, CL, CO2, GLUCOSE, BUN, CREATININE, CALCIUM, MG, PHOS in the last 8760 hours. No results for input(s): AST, ALT, ALKPHOS, BILITOT, PROT, ALBUMIN in the last 8760 hours. No results for input(s): WBC, NEUTROABS, HGB, HCT, MCV, PLT in the last 8760 hours. Lab Results  Component Value Date   TSH 1.20 10/07/2021   No results found for: HGBA1C Lab Results  Component Value Date   CHOL 158 10/07/2021   HDL  48 (L) 10/07/2021   LDLCALC 94 10/07/2021   TRIG 75 10/07/2021   CHOLHDL 3.3 10/07/2021    Significant Diagnostic Results in last 30 days:  No results found.  Assessment/Plan  Primary hypertension Blood pressure is gradually decreasing with current medication regimen. Recent reading was 139/76 mmHg. Goal is to maintain blood pressure below 130/80 mmHg. Current medications include hydrochlorothiazide  25 mg daily, losartan  50 mg daily, and amlodipine  10 mg daily. - Continue current antihypertensive medications: hydrochlorothiazide  25 mg daily, losartan  50 mg daily, and amlodipine  10 mg daily. - Monitor blood pressure at home and report if consistently above 140/90 mmHg. - Will consider increasing losartan  if blood pressure remains elevated. - Encouraged lifestyle modifications including diet and exercise.  Tobacco use disorder Reports decreased smoking frequency and altered taste perception, indicating positive response to Wellbutrin . Currently on Wellbutrin  150 mg once daily. - Increased  Wellbutrin  to 150 mg twice daily. - Advised gradual tapering of Wellbutrin  after smoking cessation: once daily for four weeks, then every other day for four weeks before discontinuation. - Recommended daily baby aspirin  (81 mg) to reduce blood clot risk associated with smoking.  General health maintenance Routine follow-up and monitoring of health status. No recent hospitalizations or significant changes in health status reported. - Ordered fasting blood work including CMP, CBC, thyroid function tests, and cholesterol. - Scheduled follow-up appointment in four months. - Encouraged regular exercise and healthy diet.   Family/ staff Communication: Reviewed plan of care with patient verbalized understanding   Labs/tests ordered: None   Next Appointment : Return in about 4 months (around 12/22/2024) for medical mangement of chronic issues.SABRA   Spent 20 minutes of Face to face and non-face to face with patient  >50% time spent counseling; reviewing medical record; tests; labs; documentation and developing future plan of care.   Roxan JAYSON Plough, NP

## 2024-08-24 LAB — COMPLETE METABOLIC PANEL WITHOUT GFR
AG Ratio: 1.3 (calc) (ref 1.0–2.5)
ALT: 16 U/L (ref 6–29)
AST: 20 U/L (ref 10–30)
Albumin: 4.8 g/dL (ref 3.6–5.1)
Alkaline phosphatase (APISO): 53 U/L (ref 31–125)
BUN: 18 mg/dL (ref 7–25)
CO2: 29 mmol/L (ref 20–32)
Calcium: 10.5 mg/dL — ABNORMAL HIGH (ref 8.6–10.2)
Chloride: 102 mmol/L (ref 98–110)
Creat: 0.82 mg/dL (ref 0.50–0.99)
Globulin: 3.6 g/dL (ref 1.9–3.7)
Glucose, Bld: 102 mg/dL — ABNORMAL HIGH (ref 65–99)
Potassium: 4.4 mmol/L (ref 3.5–5.3)
Sodium: 138 mmol/L (ref 135–146)
Total Bilirubin: 0.7 mg/dL (ref 0.2–1.2)
Total Protein: 8.4 g/dL — ABNORMAL HIGH (ref 6.1–8.1)

## 2024-08-24 LAB — CBC WITH DIFFERENTIAL/PLATELET
Absolute Lymphocytes: 1922 {cells}/uL (ref 850–3900)
Absolute Monocytes: 663 {cells}/uL (ref 200–950)
Basophils Absolute: 93 {cells}/uL (ref 0–200)
Basophils Relative: 1.5 %
Eosinophils Absolute: 81 {cells}/uL (ref 15–500)
Eosinophils Relative: 1.3 %
HCT: 42.1 % (ref 35.9–46.0)
Hemoglobin: 13.7 g/dL (ref 11.7–15.5)
MCH: 27.5 pg (ref 27.0–33.0)
MCHC: 32.5 g/dL (ref 31.6–35.4)
MCV: 84.5 fL (ref 81.4–101.7)
MPV: 12.5 fL (ref 7.5–12.5)
Monocytes Relative: 10.7 %
Neutro Abs: 3441 {cells}/uL (ref 1500–7800)
Neutrophils Relative %: 55.5 %
Platelets: 255 Thousand/uL (ref 140–400)
RBC: 4.98 Million/uL (ref 3.80–5.10)
RDW: 15.2 % — ABNORMAL HIGH (ref 11.0–15.0)
Total Lymphocyte: 31 %
WBC: 6.2 Thousand/uL (ref 3.8–10.8)

## 2024-08-24 LAB — LIPID PANEL
Cholesterol: 188 mg/dL (ref <200)
HDL: 87 mg/dL (ref >=50)
LDL Cholesterol (Calc): 87 mg/dL
Non-HDL Cholesterol (Calc): 101 mg/dL (ref <130)
Total CHOL/HDL Ratio: 2.2 (calc) (ref <5.0)
Triglycerides: 47 mg/dL (ref <150)

## 2024-08-24 LAB — TSH: TSH: 1.32 m[IU]/L

## 2024-08-25 ENCOUNTER — Ambulatory Visit: Payer: Self-pay | Admitting: Family

## 2024-12-20 ENCOUNTER — Ambulatory Visit: Admitting: Family
